# Patient Record
Sex: Male | Born: 1950 | Race: White | Hispanic: No | Marital: Single | Smoking: Never smoker
Health system: Southern US, Community
[De-identification: ages and names within clinical notes are randomized; demographics above are authoritative.]

## PROBLEM LIST (undated history)

## (undated) DIAGNOSIS — I2699 Other pulmonary embolism without acute cor pulmonale: Secondary | ICD-10-CM

## (undated) DIAGNOSIS — I82409 Acute embolism and thrombosis of unspecified deep veins of unspecified lower extremity: Secondary | ICD-10-CM

## (undated) DIAGNOSIS — I213 ST elevation (STEMI) myocardial infarction of unspecified site: Secondary | ICD-10-CM

## (undated) DIAGNOSIS — I10 Essential (primary) hypertension: Secondary | ICD-10-CM

## (undated) DIAGNOSIS — Z9889 Other specified postprocedural states: Secondary | ICD-10-CM

## (undated) DIAGNOSIS — J189 Pneumonia, unspecified organism: Secondary | ICD-10-CM

## (undated) DIAGNOSIS — Z955 Presence of coronary angioplasty implant and graft: Secondary | ICD-10-CM

## (undated) HISTORY — DX: Presence of coronary angioplasty implant and graft: Z95.5

## (undated) HISTORY — DX: Other specified postprocedural states: Z98.890

## (undated) HISTORY — DX: ST elevation (STEMI) myocardial infarction of unspecified site: I21.3

## (undated) HISTORY — DX: Essential (primary) hypertension: I10

---

## 2000-02-24 DIAGNOSIS — L309 Dermatitis, unspecified: Secondary | ICD-10-CM | POA: Insufficient documentation

## 2000-02-24 HISTORY — DX: Dermatitis, unspecified: L30.9

## 2007-05-09 DIAGNOSIS — G4700 Insomnia, unspecified: Secondary | ICD-10-CM

## 2007-05-09 HISTORY — DX: Insomnia, unspecified: G47.00

## 2008-12-03 DIAGNOSIS — E669 Obesity, unspecified: Secondary | ICD-10-CM | POA: Insufficient documentation

## 2008-12-03 HISTORY — DX: Obesity, unspecified: E66.9

## 2011-11-11 DIAGNOSIS — M23369 Other meniscus derangements, other lateral meniscus, unspecified knee: Secondary | ICD-10-CM | POA: Insufficient documentation

## 2011-11-11 HISTORY — DX: Other meniscus derangements, other lateral meniscus, unspecified knee: M23.369

## 2011-11-19 DIAGNOSIS — M179 Osteoarthritis of knee, unspecified: Secondary | ICD-10-CM | POA: Insufficient documentation

## 2011-11-19 HISTORY — DX: Osteoarthritis of knee, unspecified: M17.9

## 2014-07-04 DIAGNOSIS — I5031 Acute diastolic (congestive) heart failure: Secondary | ICD-10-CM

## 2014-07-04 DIAGNOSIS — N182 Chronic kidney disease, stage 2 (mild): Secondary | ICD-10-CM

## 2014-07-04 DIAGNOSIS — I82403 Acute embolism and thrombosis of unspecified deep veins of lower extremity, bilateral: Secondary | ICD-10-CM

## 2014-07-04 HISTORY — DX: Chronic kidney disease, stage 2 (mild): N18.2

## 2014-07-04 HISTORY — DX: Acute diastolic (congestive) heart failure: I50.31

## 2014-07-04 HISTORY — DX: Acute embolism and thrombosis of unspecified deep veins of lower extremity, bilateral: I82.403

## 2014-08-27 DIAGNOSIS — E781 Pure hyperglyceridemia: Secondary | ICD-10-CM | POA: Insufficient documentation

## 2014-08-27 HISTORY — DX: Pure hyperglyceridemia: E78.1

## 2015-01-15 DIAGNOSIS — L209 Atopic dermatitis, unspecified: Secondary | ICD-10-CM | POA: Insufficient documentation

## 2015-01-15 HISTORY — DX: Atopic dermatitis, unspecified: L20.9

## 2015-04-16 DIAGNOSIS — K621 Rectal polyp: Secondary | ICD-10-CM

## 2015-04-16 HISTORY — DX: Rectal polyp: K62.1

## 2016-04-16 DIAGNOSIS — D6859 Other primary thrombophilia: Secondary | ICD-10-CM | POA: Insufficient documentation

## 2016-04-16 HISTORY — DX: Other primary thrombophilia: D68.59

## 2018-06-14 DIAGNOSIS — E559 Vitamin D deficiency, unspecified: Secondary | ICD-10-CM | POA: Insufficient documentation

## 2018-06-14 HISTORY — DX: Vitamin D deficiency, unspecified: E55.9

## 2018-08-21 DIAGNOSIS — M62838 Other muscle spasm: Secondary | ICD-10-CM | POA: Insufficient documentation

## 2018-08-21 HISTORY — DX: Other muscle spasm: M62.838

## 2019-12-13 ENCOUNTER — Other Ambulatory Visit: Payer: Self-pay

## 2019-12-13 ENCOUNTER — Emergency Department (HOSPITAL_BASED_OUTPATIENT_CLINIC_OR_DEPARTMENT_OTHER): Payer: Medicare Other

## 2019-12-13 ENCOUNTER — Inpatient Hospital Stay (HOSPITAL_BASED_OUTPATIENT_CLINIC_OR_DEPARTMENT_OTHER)
Admission: EM | Admit: 2019-12-13 | Discharge: 2019-12-17 | DRG: 270 | Disposition: A | Discharge status: Home / Self Care | Payer: Medicare Other | Attending: Interventional Cardiology | Admitting: Interventional Cardiology

## 2019-12-13 ENCOUNTER — Encounter (HOSPITAL_COMMUNITY)
Admission: EM | Disposition: A | Discharge status: Home / Self Care | Payer: Self-pay | Source: Home / Self Care | Attending: Interventional Cardiology

## 2019-12-13 ENCOUNTER — Encounter (HOSPITAL_BASED_OUTPATIENT_CLINIC_OR_DEPARTMENT_OTHER): Payer: Self-pay

## 2019-12-13 DIAGNOSIS — I213 ST elevation (STEMI) myocardial infarction of unspecified site: Secondary | ICD-10-CM

## 2019-12-13 DIAGNOSIS — Z6839 Body mass index (BMI) 39.0-39.9, adult: Secondary | ICD-10-CM | POA: Diagnosis not present

## 2019-12-13 DIAGNOSIS — Z86711 Personal history of pulmonary embolism: Secondary | ICD-10-CM | POA: Diagnosis not present

## 2019-12-13 DIAGNOSIS — I2699 Other pulmonary embolism without acute cor pulmonale: Secondary | ICD-10-CM

## 2019-12-13 DIAGNOSIS — I2102 ST elevation (STEMI) myocardial infarction involving left anterior descending coronary artery: Secondary | ICD-10-CM

## 2019-12-13 DIAGNOSIS — Z86718 Personal history of other venous thrombosis and embolism: Secondary | ICD-10-CM | POA: Diagnosis not present

## 2019-12-13 DIAGNOSIS — Z9889 Other specified postprocedural states: Secondary | ICD-10-CM

## 2019-12-13 DIAGNOSIS — I2582 Chronic total occlusion of coronary artery: Secondary | ICD-10-CM | POA: Diagnosis not present

## 2019-12-13 DIAGNOSIS — Z79899 Other long term (current) drug therapy: Secondary | ICD-10-CM

## 2019-12-13 DIAGNOSIS — I130 Hypertensive heart and chronic kidney disease with heart failure and stage 1 through stage 4 chronic kidney disease, or unspecified chronic kidney disease: Secondary | ICD-10-CM | POA: Diagnosis not present

## 2019-12-13 DIAGNOSIS — J9601 Acute respiratory failure with hypoxia: Secondary | ICD-10-CM | POA: Diagnosis not present

## 2019-12-13 DIAGNOSIS — I5020 Unspecified systolic (congestive) heart failure: Secondary | ICD-10-CM

## 2019-12-13 DIAGNOSIS — I255 Ischemic cardiomyopathy: Secondary | ICD-10-CM | POA: Diagnosis not present

## 2019-12-13 DIAGNOSIS — Z8701 Personal history of pneumonia (recurrent): Secondary | ICD-10-CM

## 2019-12-13 DIAGNOSIS — I5021 Acute systolic (congestive) heart failure: Secondary | ICD-10-CM

## 2019-12-13 DIAGNOSIS — R570 Cardiogenic shock: Secondary | ICD-10-CM | POA: Diagnosis not present

## 2019-12-13 DIAGNOSIS — N179 Acute kidney failure, unspecified: Secondary | ICD-10-CM | POA: Diagnosis not present

## 2019-12-13 DIAGNOSIS — I5043 Acute on chronic combined systolic (congestive) and diastolic (congestive) heart failure: Secondary | ICD-10-CM | POA: Diagnosis not present

## 2019-12-13 DIAGNOSIS — Z20822 Contact with and (suspected) exposure to covid-19: Secondary | ICD-10-CM | POA: Diagnosis present

## 2019-12-13 DIAGNOSIS — Z7901 Long term (current) use of anticoagulants: Secondary | ICD-10-CM | POA: Diagnosis not present

## 2019-12-13 DIAGNOSIS — I2510 Atherosclerotic heart disease of native coronary artery without angina pectoris: Secondary | ICD-10-CM | POA: Diagnosis present

## 2019-12-13 DIAGNOSIS — E669 Obesity, unspecified: Secondary | ICD-10-CM | POA: Diagnosis not present

## 2019-12-13 DIAGNOSIS — Z955 Presence of coronary angioplasty implant and graft: Secondary | ICD-10-CM

## 2019-12-13 DIAGNOSIS — E785 Hyperlipidemia, unspecified: Secondary | ICD-10-CM | POA: Diagnosis present

## 2019-12-13 DIAGNOSIS — N184 Chronic kidney disease, stage 4 (severe): Secondary | ICD-10-CM

## 2019-12-13 HISTORY — DX: Acute embolism and thrombosis of unspecified deep veins of unspecified lower extremity: I82.409

## 2019-12-13 HISTORY — DX: Other pulmonary embolism without acute cor pulmonale: I26.99

## 2019-12-13 HISTORY — DX: Essential (primary) hypertension: I10

## 2019-12-13 HISTORY — PX: IABP INSERTION: CATH118242

## 2019-12-13 HISTORY — PX: CORONARY STENT INTERVENTION: CATH118234

## 2019-12-13 HISTORY — DX: Acute systolic (congestive) heart failure: I50.21

## 2019-12-13 HISTORY — DX: Chronic kidney disease, stage 4 (severe): N18.4

## 2019-12-13 HISTORY — PX: CORONARY/GRAFT ACUTE MI REVASCULARIZATION: CATH118305

## 2019-12-13 HISTORY — DX: ST elevation (STEMI) myocardial infarction involving left anterior descending coronary artery: I21.02

## 2019-12-13 HISTORY — PX: LEFT HEART CATH AND CORONARY ANGIOGRAPHY: CATH118249

## 2019-12-13 HISTORY — DX: Pneumonia, unspecified organism: J18.9

## 2019-12-13 LAB — CBC WITH DIFFERENTIAL/PLATELET
Abs Immature Granulocytes: 0.05 10*3/uL (ref 0.00–0.07)
Basophils Absolute: 0.1 10*3/uL (ref 0.0–0.1)
Basophils Relative: 1 %
Eosinophils Absolute: 0.5 10*3/uL (ref 0.0–0.5)
Eosinophils Relative: 4 %
HCT: 49.4 % (ref 39.0–52.0)
Hemoglobin: 17.1 g/dL — ABNORMAL HIGH (ref 13.0–17.0)
Immature Granulocytes: 0 %
Lymphocytes Relative: 37 %
Lymphs Abs: 4.7 10*3/uL — ABNORMAL HIGH (ref 0.7–4.0)
MCH: 34.8 pg — ABNORMAL HIGH (ref 26.0–34.0)
MCHC: 34.6 g/dL (ref 30.0–36.0)
MCV: 100.6 fL — ABNORMAL HIGH (ref 80.0–100.0)
Monocytes Absolute: 1.5 10*3/uL — ABNORMAL HIGH (ref 0.1–1.0)
Monocytes Relative: 12 %
Neutro Abs: 5.9 10*3/uL (ref 1.7–7.7)
Neutrophils Relative %: 46 %
Platelets: 287 10*3/uL (ref 150–400)
RBC: 4.91 MIL/uL (ref 4.22–5.81)
RDW: 12.3 % (ref 11.5–15.5)
WBC: 12.7 10*3/uL — ABNORMAL HIGH (ref 4.0–10.5)
nRBC: 0 % (ref 0.0–0.2)

## 2019-12-13 LAB — PROTIME-INR
INR: 1 (ref 0.8–1.2)
Prothrombin Time: 13 seconds (ref 11.4–15.2)

## 2019-12-13 LAB — COMPREHENSIVE METABOLIC PANEL
ALT: 27 U/L (ref 0–44)
AST: 31 U/L (ref 15–41)
Albumin: 3.7 g/dL (ref 3.5–5.0)
Alkaline Phosphatase: 70 U/L (ref 38–126)
Anion gap: 12 (ref 5–15)
BUN: 29 mg/dL — ABNORMAL HIGH (ref 8–23)
CO2: 22 mmol/L (ref 22–32)
Calcium: 9.1 mg/dL (ref 8.9–10.3)
Chloride: 102 mmol/L (ref 98–111)
Creatinine, Ser: 1.9 mg/dL — ABNORMAL HIGH (ref 0.61–1.24)
GFR calc Af Amer: 41 mL/min — ABNORMAL LOW (ref >60)
GFR calc non Af Amer: 36 mL/min — ABNORMAL LOW (ref >60)
Glucose, Bld: 132 mg/dL — ABNORMAL HIGH (ref 70–99)
Potassium: 3.6 mmol/L (ref 3.5–5.1)
Sodium: 136 mmol/L (ref 135–145)
Total Bilirubin: 0.6 mg/dL (ref 0.3–1.2)
Total Protein: 7.3 g/dL (ref 6.5–8.1)

## 2019-12-13 LAB — RESPIRATORY PANEL BY RT PCR (FLU A&B, COVID)
Influenza A by PCR: NEGATIVE
Influenza B by PCR: NEGATIVE
SARS Coronavirus 2 by RT PCR: NEGATIVE

## 2019-12-13 LAB — TROPONIN I (HIGH SENSITIVITY): Troponin I (High Sensitivity): 19 ng/L — ABNORMAL HIGH (ref <18)

## 2019-12-13 LAB — APTT: aPTT: 29 seconds (ref 24–36)

## 2019-12-13 SURGERY — CORONARY/GRAFT ACUTE MI REVASCULARIZATION
Anesthesia: LOCAL

## 2019-12-13 MED ORDER — TICAGRELOR 90 MG PO TABS: ORAL_TABLET | ORAL | Status: DC | PRN

## 2019-12-13 MED ORDER — NITROGLYCERIN IN D5W 200-5 MCG/ML-% IV SOLN
INTRAVENOUS | Status: AC
  Filled 2019-12-13: qty 250

## 2019-12-13 MED ORDER — HEPARIN (PORCINE) IN NACL 1000-0.9 UT/500ML-% IV SOLN
INTRAVENOUS | Status: DC | PRN
  Administered 2019-12-13 (×3): 500 mL

## 2019-12-13 MED ORDER — NITROGLYCERIN 0.4 MG SL SUBL
0.4000 mg | SUBLINGUAL_TABLET | SUBLINGUAL | Status: DC | PRN
  Administered 2019-12-13: 0.4 mg via SUBLINGUAL

## 2019-12-13 MED ORDER — VERAPAMIL HCL 2.5 MG/ML IV SOLN
INTRAVENOUS | Status: AC
  Filled 2019-12-13: qty 2

## 2019-12-13 MED ORDER — TIROFIBAN HCL IN NACL 5-0.9 MG/100ML-% IV SOLN
INTRAVENOUS | Status: AC
  Filled 2019-12-13: qty 100

## 2019-12-13 MED ORDER — NITROGLYCERIN 1 MG/10 ML FOR IR/CATH LAB
INTRA_ARTERIAL | Status: DC | PRN
  Administered 2019-12-13: 200 ug via INTRACORONARY

## 2019-12-13 MED ORDER — LIDOCAINE HCL (PF) 1 % IJ SOLN
INTRAMUSCULAR | Status: AC
  Filled 2019-12-13: qty 30

## 2019-12-13 MED ORDER — VERAPAMIL HCL 2.5 MG/ML IV SOLN
INTRAVENOUS | Status: DC | PRN
  Administered 2019-12-13: 10 mL via INTRA_ARTERIAL

## 2019-12-13 MED ORDER — FUROSEMIDE 10 MG/ML IJ SOLN
INTRAMUSCULAR | Status: AC
  Filled 2019-12-13: qty 4

## 2019-12-13 MED ORDER — HEPARIN SODIUM (PORCINE) 1000 UNIT/ML IJ SOLN
INTRAMUSCULAR | Status: DC | PRN
  Administered 2019-12-13: 8000 [IU] via INTRAVENOUS
  Administered 2019-12-13: 3000 [IU] via INTRAVENOUS

## 2019-12-13 MED ORDER — CLOPIDOGREL BISULFATE 300 MG PO TABS
ORAL_TABLET | ORAL | Status: AC
  Filled 2019-12-13: qty 2

## 2019-12-13 MED ORDER — HEPARIN SODIUM (PORCINE) 5000 UNIT/ML IJ SOLN
4000.0000 [IU] | Freq: Once | INTRAMUSCULAR | Status: AC
  Administered 2019-12-13: 4000 [IU] via INTRAVENOUS

## 2019-12-13 MED ORDER — TIROFIBAN HCL IN NACL 5-0.9 MG/100ML-% IV SOLN
INTRAVENOUS | Status: AC | PRN
  Administered 2019-12-13 (×2): 0.075 ug/kg/min via INTRAVENOUS

## 2019-12-13 MED ORDER — LIDOCAINE HCL (PF) 1 % IJ SOLN
INTRAMUSCULAR | Status: DC | PRN
  Administered 2019-12-13: 2 mL
  Administered 2019-12-13: 15 mL

## 2019-12-13 MED ORDER — SODIUM CHLORIDE 0.9 % IV SOLN
INTRAVENOUS | Status: AC | PRN
  Administered 2019-12-13: 20 mL/h via INTRAVENOUS

## 2019-12-13 MED ORDER — HEPARIN SODIUM (PORCINE) 1000 UNIT/ML IJ SOLN
INTRAMUSCULAR | Status: AC
  Filled 2019-12-13: qty 1

## 2019-12-13 MED ORDER — FUROSEMIDE 10 MG/ML IJ SOLN
INTRAMUSCULAR | Status: DC | PRN
  Administered 2019-12-13: 40 mg via INTRAVENOUS

## 2019-12-13 MED ORDER — SODIUM CHLORIDE 0.9 % IV SOLN
INTRAVENOUS | Status: DC | PRN
  Administered 2019-12-13: 10 mL/h via INTRAVENOUS

## 2019-12-13 MED ORDER — ASPIRIN 81 MG PO CHEW: 324.0000 mg | CHEWABLE_TABLET | Freq: Once | ORAL | Status: AC

## 2019-12-13 MED ORDER — IOHEXOL 350 MG/ML SOLN
INTRAVENOUS | Status: DC | PRN
  Administered 2019-12-13: 160 mL via INTRA_ARTERIAL

## 2019-12-13 MED ORDER — NITROGLYCERIN IN D5W 200-5 MCG/ML-% IV SOLN
INTRAVENOUS | Status: AC | PRN
  Administered 2019-12-13: 10 ug/min via INTRAVENOUS

## 2019-12-13 MED ORDER — FENTANYL CITRATE (PF) 100 MCG/2ML IJ SOLN
INTRAMUSCULAR | Status: DC | PRN
  Administered 2019-12-13: 25 ug via INTRAVENOUS

## 2019-12-13 MED ORDER — NITROGLYCERIN 0.4 MG SL SUBL
SUBLINGUAL_TABLET | SUBLINGUAL | Status: AC
  Filled 2019-12-13: qty 3

## 2019-12-13 MED ORDER — ASPIRIN 81 MG PO CHEW
CHEWABLE_TABLET | ORAL | Status: AC
  Administered 2019-12-13: 324 mg via ORAL
  Filled 2019-12-13: qty 4

## 2019-12-13 MED ORDER — SODIUM CHLORIDE 0.9 % IV SOLN: INTRAVENOUS | Status: DC

## 2019-12-13 MED ORDER — CLOPIDOGREL BISULFATE 300 MG PO TABS
ORAL_TABLET | ORAL | Status: DC | PRN
  Administered 2019-12-13: 600 mg via ORAL

## 2019-12-13 MED ORDER — FENTANYL CITRATE (PF) 100 MCG/2ML IJ SOLN
INTRAMUSCULAR | Status: AC
  Filled 2019-12-13: qty 2

## 2019-12-13 MED ORDER — TIROFIBAN (AGGRASTAT) BOLUS VIA INFUSION
INTRAVENOUS | Status: DC | PRN
  Administered 2019-12-13: 3267.5 ug via INTRAVENOUS

## 2019-12-13 MED ORDER — IOHEXOL 350 MG/ML SOLN
INTRAVENOUS | Status: AC
  Filled 2019-12-13: qty 1

## 2019-12-13 MED ORDER — HEPARIN SODIUM (PORCINE) 5000 UNIT/ML IJ SOLN
INTRAMUSCULAR | Status: AC
  Filled 2019-12-13: qty 1

## 2019-12-13 SURGICAL SUPPLY — 22 items
BALLN IABP SENSA PLUS 8F 50CC (BALLOONS) ×2
BALLN SAPPHIRE 2.5X12 (BALLOONS) ×2
BALLOON IABP SENS PLUS 8F 50CC (BALLOONS) ×1 IMPLANT
BALLOON SAPPHIRE 2.5X12 (BALLOONS) ×1 IMPLANT
CATH 5FR JL3.5 JR4 ANG PIG MP (CATHETERS) ×2 IMPLANT
CATH TELESCOPE 6F GEC (CATHETERS) ×2 IMPLANT
CATH VISTA GUIDE 6FR XB3.5 (CATHETERS) ×2 IMPLANT
DEVICE RAD COMP TR BAND LRG (VASCULAR PRODUCTS) ×2 IMPLANT
GLIDESHEATH SLEND A-KIT 6F 22G (SHEATH) ×2 IMPLANT
GUIDEWIRE INQWIRE 1.5J.035X260 (WIRE) ×1 IMPLANT
INQWIRE 1.5J .035X260CM (WIRE) ×2
KIT ENCORE 26 ADVANTAGE (KITS) ×2 IMPLANT
KIT HEART LEFT (KITS) ×2 IMPLANT
PACK CARDIAC CATHETERIZATION (CUSTOM PROCEDURE TRAY) ×2 IMPLANT
SHEATH PINNACLE 6F 10CM (SHEATH) ×2 IMPLANT
SHEATH PROBE COVER 6X72 (BAG) ×2 IMPLANT
STENT RESOLUTE ONYX 3.0X26 (Permanent Stent) ×2 IMPLANT
STENT RESOLUTE ONYX 3.0X34 (Permanent Stent) ×2 IMPLANT
TRANSDUCER W/STOPCOCK (MISCELLANEOUS) ×2 IMPLANT
TUBING CIL FLEX 10 FLL-RA (TUBING) ×2 IMPLANT
WIRE ASAHI PROWATER 180CM (WIRE) ×2 IMPLANT
WIRE EMERALD 3MM-J .035X150CM (WIRE) ×2 IMPLANT

## 2019-12-13 NOTE — ED Notes (Signed)
Code STEMI called to [facility]) -

## 2019-12-13 NOTE — CV Procedure (Signed)
   Acute anterior ST elevation MI commencing at 7 PM.  Hypertensive on arrival with early pulmonary edema on chest x-ray, normal O2 saturations, atypical EKG for anterior MI with lead lead V2 reviewed for ST elevation and right bundle branch block.  Real-time vascular ultrasound was used to achieve right radial artery access.  JR4 catheter was used to perform left ventriculography by hand-injection, hemodynamic recordings, and right coronary angiography.  Left ventricular end-diastolic pressure was 34 mmHg  A 6 French 3.5 cm XB left coronary guide catheter was used for left coronary angiography.  Angiography demonstrated proximal total occlusion of the LAD.  A large ramus and moderate size circumflex were widely patent without significant obstruction.  PCI was performed using a 0.014 [place] guidewire.  The guide catheter did not provide sufficient support however we were able to cross the stenosis and move the guidewire into the mid to distal LAD.  Wire advancement would back the guide cath out of the left main ostium..  Angioplasty was performed in sequential manner using a 12 mm x 2.5 balloon from proximal to mid vessel.  This reestablished flow but there was significant thrombus burden.  TIMI grade II flow was established.  A telescope guide catheter extension was needed advanced long stents into the LAD.  The initial stent was a 34 x 3.0 Onyx.  This was deployed at 14 atm x 2.  The next it was a 26 x 3.0 Onyx also deployed at 14 atm x 2.  The deployment balloon was further advanced dilate the overlap to 15 atm.  TIMI grade 2.5 flow was established.  Symptoms began to improve.  During the procedure, the patient began having respiratory difficulty, coughing, and having phlegm coming into the back of his throat.  We realized during the case that the patient's creatinine was 1.9.  40 mg of IV Lasix was administered.  A nonrebreather mask was placed.  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  Real-time vascular ultrasound was used to perform a single anterior wall stick slightly above the bifurcation which was high in origin.  Balloon pumping starting at 1-1 with good pressure augmentation.  Is gradually led to resolution of chest pain and shortness of breath significantly improved.  Pharmacology received during the procedure was 15,000 units of heparin, a bolus and infusion of Aggrastat, Plavix 600 mg orally, and earlier today the patient received 2.5 mg of Eliquis.  IV nitroglycerin drip was started at 20 mcg/min.  Complicated high risk patient with acute heart failure, stage IV chronic kidney disease (160 cc of contrast was used), and coronary disease with total occlusion of the distal right coronary in the setting of acute anterior infarction with proximal LAD occlusion.  Outcome is guarded.  We will not be able to hydrate the patient very well given high EDP and heart failure.  Intra-aortic balloon pump should help to unload the patient's ventricle and IV NTG will improve microcirculation given heavy thrombus load.

## 2019-12-13 NOTE — ED Triage Notes (Signed)
Pt c/o CP x 1 hour-NAD-steady gait °

## 2019-12-13 NOTE — ED Notes (Signed)
  [facility] transporting patient to [facility] cath lab, left at 2146.  Report called to [facility] cath lab at 2153

## 2019-12-13 NOTE — ED Notes (Signed)
MD at bedside. Informed to hold Nitro b/p

## 2019-12-13 NOTE — ED Notes (Signed)
Pt is out of department via [place]

## 2019-12-13 NOTE — ED Provider Notes (Signed)
[facility] Provider Note   CSN: [id number] Arrival date & time: 12/13/19  2109     History Chief Complaint  Patient presents with  . Chest Pain    Boss Danielsen is a 69 y.o. male.  HPI 69 year old male presents with chest pain.  Started an hour ago and feels like a burning in the middle of his chest.  No diaphoresis but he does have nausea.  No real shortness of breath.  No back pain.  Pain is 8 out of 10.  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  No prior coronary disease history.  No smoking history.   Past Medical History:  Diagnosis Date  . DVT (deep venous thrombosis) ([place])   . Hypertension   . Pneumonia   . Pulmonary emboli ([facility])     There are no problems to display for this patient.   History reviewed. No pertinent surgical history.     No family history on file.  Social History   Tobacco Use  . Smoking status: Never Smoker  . Smokeless tobacco: Current User  Substance Use Topics  . Alcohol use: Yes    Comment: daily  . Drug use: Never    Home Medications Prior to Admission medications   Medication Sig Start Date End Date Taking? Authorizing Provider  ELIQUIS 2.5 MG TABS tablet Take 2.5 mg by mouth 2 (two) times daily. 10/08/19   [provider]  metoprolol tartrate (LOPRESSOR) 50 MG tablet Take 50 mg by mouth 2 (two) times daily. 11/18/19   [provider]  spironolactone (ALDACTONE) 25 MG tablet Take 25 mg by mouth every morning. 10/03/19   [provider]    Allergies    Patient has no known allergies.  Review of Systems   Review of Systems  Constitutional: Negative for diaphoresis and fever.  Respiratory: Negative for shortness of breath.   Cardiovascular: Positive for chest pain.  Gastrointestinal: Positive for nausea. Negative for vomiting.  Musculoskeletal: Negative for back pain.  All other systems reviewed and are negative.   Physical Exam Updated  Vital Signs BP (!) 130/103 (BP Location: Left Arm)   Pulse 86   Resp 20   Ht 5\' 9"  (1.753 m)   Wt 130.7 kg   SpO2 98%   BMI 42.54 kg/m   Physical Exam Vitals and nursing note reviewed.  Constitutional:      Appearance: He is well-developed. He is obese. He is not diaphoretic.  HENT:     Head: Normocephalic and atraumatic.     Right Ear: External ear normal.     Left Ear: External ear normal.     Nose: Nose normal.  Eyes:     General:        Right eye: No discharge.        Left eye: No discharge.  Cardiovascular:     Rate and Rhythm: Normal rate and regular rhythm.     Heart sounds: Normal heart sounds.  Pulmonary:     Effort: Pulmonary effort is normal.     Breath sounds: Normal breath sounds.  Chest:     Chest wall: No tenderness.  Abdominal:     Palpations: Abdomen is soft.     Tenderness: There is no abdominal tenderness.  Musculoskeletal:     Cervical back: Neck supple.  Skin:    General: Skin is warm and dry.  Neurological:     Mental Status: He is alert.  Psychiatric:        Mood and Affect: Mood is not anxious.     ED Results / Procedures / Treatments   Labs (all labs ordered are listed, but only abnormal results are displayed) Labs Reviewed  CBC WITH DIFFERENTIAL/PLATELET - Abnormal; Notable for the following components:      Result Value   WBC 12.7 (*)    Hemoglobin 17.1 (*)    MCV 100.6 (*)    MCH 34.8 (*)    Lymphs Abs 4.7 (*)    Monocytes Absolute 1.5 (*)    All other components within normal limits  RESPIRATORY PANEL BY RT PCR (FLU A&B, COVID)  PROTIME-INR  APTT  HEMOGLOBIN A1C  COMPREHENSIVE METABOLIC PANEL  LIPID PANEL  TROPONIN I (HIGH SENSITIVITY)    EKG None  ED ECG REPORT   Date: 12/13/2019  Rate: 75  Rhythm: normal sinus rhythm  QRS Axis: normal  Intervals: normal  ST/T Wave abnormalities: ST elevations anteriorly  Conduction Disutrbances:right bundle branch block  Narrative Interpretation:   Old EKG Reviewed: none  available  I have personally reviewed the EKG tracing and agree with the computerized printout as noted.        Radiology DG Chest Port 1 View  Result Date: 12/13/2019 CLINICAL DATA:  Chest pain for 1 hour EXAM: PORTABLE CHEST 1 VIEW COMPARISON:  None. FINDINGS: No pleural effusion or focal consolidation. Borderline cardiomegaly. Mild central vascular congestion. Diffuse interstitial opacity. No pneumothorax. IMPRESSION: Borderline cardiomegaly with mild central vascular congestion. Diffuse interstitial opacity, could reflect mild interstitial edema. Electronically Signed   By: Donavan Foil M.D.   On: 12/13/2019 21:39    Procedures .Critical Care Performed by: Sherwood Gambler, MD Authorized by: Sherwood Gambler, MD   Critical care provider statement:    Critical care time (minutes):  30   Critical care time was exclusive of:  Separately billable procedures and treating other patients   Critical care was necessary to treat or prevent imminent or life-threatening deterioration of the following conditions:  Cardiac failure   Critical care was time spent personally by me on the following activities:  Discussions with consultants, evaluation of patient's response to treatment, examination of patient, ordering and performing treatments and interventions, ordering and review of laboratory studies, ordering and review of radiographic studies, pulse oximetry, re-evaluation of patient's condition, obtaining history from patient or surrogate and review of old charts   (including critical care time)  Medications Ordered in ED Medications  0.9 %  sodium chloride infusion (has no administration in time range)  nitroGLYCERIN (NITROSTAT) SL tablet 0.4 mg (0.4 mg Sublingual Given 12/13/19 2128)  nitroGLYCERIN (NITROSTAT) 0.4 MG SL tablet (has no administration in time range)  aspirin chewable tablet 324 mg (324 mg Oral Given 12/13/19 2132)  heparin injection 4,000 Units (4,000 Units Intravenous Given  12/13/19 2128)    ED Course  I have reviewed the triage vital signs and the nursing notes.  Pertinent labs & imaging results that were available during my care of the patient were reviewed by me and considered in my medical decision making (see chart for details).  Clinical Course as of Dec 12 2149  Thu Dec 13, 2019  2129 Discussed with Dr. Tamala Julian.  He cannot see the ECG but will activate code STEMI.  Will give aspirin and heparin.  [facility] is sending [facility] for emergent transport.   [SG]    Clinical Course User Index [SG] Sherwood Gambler, MD   MDM Rules/Calculators/A&P  ECG concerning for anterior STEMI.  V3 is definitively elevated and it appears to be for his as well though complicated by a bundle branch block.  No old to compare to.  He is hemodynamically stable though hypertensive.  Getting some pain relief with nitro and was given aspirin and heparin.  Will send for emergent catheterization and STEMI alert.  Dr. Francia Greaves aware, but patient should be going straight to the Cath Lab. Final Clinical Impression(s) / ED Diagnoses Final diagnoses:  ST elevation myocardial infarction (STEMI), unspecified artery [facility])    Rx / DC Orders ED Discharge Orders    None       Sherwood Gambler, MD 12/13/19 2152

## 2019-12-13 NOTE — H&P (Signed)
Cardiology Admission History and Physical:   Patient ID: Thomas Carpenter MRN: [id number]; DOB: 10/07/1950   Admission date: 12/13/2019  Primary Care Provider: System, Pcp Not In Primary Cardiologist: No primary care provider on file.  Primary Electrophysiologist:  None   Chief Complaint:  Chest pain  Patient Profile:   Thomas Carpenter is a 69 y.o. 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   History of Present Illness:   Thomas Carpenter reports acute onset chest pain beginning around 7pm. Never had anything like this before. The pain felt like a burning in the center of his chest and was associated with nausea. Has some shortness of breath and cough with his chest pain.   Patient has no prior history of coronary disease and is a never smoker.   Came to [place] and was found to have anterior ST elevation on ECG. Was transferred to [facility] cath lab for STEMI.   Heart Pathway Score:     Past Medical History:  Diagnosis Date  . DVT (deep venous thrombosis) ([place])   . Hypertension   . Pneumonia   . Pulmonary emboli ([place])     History reviewed. No pertinent surgical history.   Medications Prior to Admission: Prior to Admission medications   Medication Sig Start Date End Date Taking? Authorizing Provider  ELIQUIS 2.5 MG TABS tablet Take 2.5 mg by mouth 2 (two) times daily. 10/08/19   [provider]  metoprolol tartrate (LOPRESSOR) 50 MG tablet Take 50 mg by mouth 2 (two) times daily. 11/18/19   [provider]  spironolactone (ALDACTONE) 25 MG tablet Take 25 mg by mouth every morning. 10/03/19   [provider]     Allergies:   No Known Allergies  Social History:   Social History   Socioeconomic History  . Marital status: Single    Spouse name: Not on file  . Number of children: Not on file  . Years of education: Not on file  . Highest education level: Not on file  Occupational History  .  Not on file  Tobacco Use  . Smoking status: Never Smoker  . Smokeless tobacco: Current User  Substance and Sexual Activity  . Alcohol use: Yes    Comment: daily  . Drug use: Never  . Sexual activity: Not on file  Other Topics Concern  . Not on file  Social History Narrative  . Not on file   Social Determinants of Health   Financial Resource Strain:   . Difficulty of Paying Living Expenses:   Food Insecurity:   . Worried About Charity fundraiser in the Last Year:   . Arboriculturist in the Last Year:   Transportation Needs:   . Film/video editor (Medical):   Marland Kitchen Lack of Transportation (Non-Medical):   Physical Activity:   . Days of Exercise per Week:   . Minutes of Exercise per Session:   Stress:   . Feeling of Stress :   Social Connections:   . Frequency of Communication with Friends and Family:   . Frequency of Social Gatherings with Friends and Family:   . Attends Religious Services:   . Active Member of Clubs or Organizations:   . Attends Archivist Meetings:   Marland Kitchen Marital Status:   Intimate Partner Violence:   . Fear of Current or Ex-Partner:   . Emotionally Abused:   Marland Kitchen Physically Abused:   . Sexually  Abused:     Family History:   The patient's family history is not on file.    ROS:  Please see the history of present illness.  All other ROS reviewed and negative.     Physical Exam/Data:   Vitals:   12/13/19 2256 12/13/19 2321 12/13/19 2321 12/13/19 2325  BP:    (!) 162/105  Pulse:      Resp:      Temp:      TempSrc:      SpO2: 91% 91% 99%   Weight:      Height:       No intake or output data in the 24 hours ending 12/13/19 2350 Last 3 Weights 12/13/2019  Weight (lbs) 288 lb 1.6 oz  Weight (kg) 130.681 kg     Body mass index is 42.54 kg/m.  General:  Well nourished, well developed, tachypneic HEENT: normal Lymph: no adenopathy Neck: unable to assess JVD due to habitus Endocrine:  No thryomegaly Vascular: No carotid bruits; FA  pulses 2+ bilaterally without bruits  Cardiac:  normal S1, S2; RRR; soft systolic murmur heard at apex Lungs:  Faint crackles bilaterally Abd: soft, nontender, obese, no hepatomegaly  Ext: 2+ pitting edema of BLE Musculoskeletal:  No deformities, BUE and BLE strength normal and equal Skin: warm and dry  Neuro:  CNs 2-12 intact, no focal abnormalities noted Psych:  Normal affect    EKG:  The ECG that was done with EMS was personally reviewed and demonstrates anterior ST elevation  Relevant CV Studies: None  Laboratory Data:  High Sensitivity Troponin:   Recent Labs  Lab 12/13/19 2125  TROPONINIHS 19*      Chemistry Recent Labs  Lab 12/13/19 2125  NA 136  K 3.6  CL 102  CO2 22  GLUCOSE 132*  BUN 29*  CREATININE 1.90*  CALCIUM 9.1  GFRNONAA 36*  GFRAA 41*  ANIONGAP 12    Recent Labs  Lab 12/13/19 2125  PROT 7.3  ALBUMIN 3.7  AST 31  ALT 27  ALKPHOS 70  BILITOT 0.6   Hematology Recent Labs  Lab 12/13/19 2125  WBC 12.7*  RBC 4.91  HGB 17.1*  HCT 49.4  MCV 100.6*  MCH 34.8*  MCHC 34.6  RDW 12.3  PLT 287   BNPNo results for input(s): BNP, PROBNP in the last 168 hours.  DDimer No results for input(s): DDIMER in the last 168 hours.   Radiology/Studies:  DG Chest Port 1 View  Result Date: 12/13/2019 CLINICAL DATA:  Chest pain for 1 hour EXAM: PORTABLE CHEST 1 VIEW COMPARISON:  None. FINDINGS: No pleural effusion or focal consolidation. Borderline cardiomegaly. Mild central vascular congestion. Diffuse interstitial opacity. No pneumothorax. IMPRESSION: Borderline cardiomegaly with mild central vascular congestion. Diffuse interstitial opacity, could reflect mild interstitial edema. Electronically Signed   By: Donavan Foil M.D.   On: 12/13/2019 21:39       TIMI Risk Score for ST  Elevation MI:   The patient's TIMI risk score is 8, which indicates a 26.8% risk of all cause mortality at 30 days.    Assessment and Plan:  Thomas Carpenter is a 69 y.o.  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 Now s/p PCI to occluded prox LAD. Patient developed respiratory distress during case and found to have LVEDP of 36. IABP placed.    #) STEMI:  Proximal LAD occlusion - echo in AM - check lipids, A1c - ASA  81mg  daily - heparin drip for ACS per pharmacy protocol - atorvastatin 80mg  QHS - hold ACE given contrast load and elevated creatinine - start metoprolol tartrate low dose - SLN, nitro gtt PRN - plavix 600mg  x 1 then 75mg  daily (given that he will need oral anticoagulant as well) - cardiac rehab - IABP management as per below  #) Hypoxemia, respiratory distress: IABP placed and patient on non-rebreather with improvement in symptoms. S/p 40mg  IV lasix x1.  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 Inpatient status is judged to be reasonable and necessary in order to provide the required intensity of service to ensure the patient's safety. The patient's presenting symptoms, physical exam findings, and initial radiographic and laboratory data in the context of their chronic comorbidities is felt to place them at high risk for further clinical deterioration. Furthermore, it is not anticipated that the patient will be medically stable for discharge from the hospital within 2 midnights of  admission. The following factors support the patient status of inpatient.   " The patient's presenting symptoms include chest pain. " The worrisome physical exam findings include pulmonary rales. " The initial radiographic and laboratory data are worrisome because of pulmonary edema. " The chronic co-morbidities include CKD.   * I certify that at the point of admission it is my clinical judgment that the patient will require inpatient hospital care spanning beyond 2 midnights from the point of admission due to high intensity of service, high risk for further deterioration and high frequency of surveillance required.*    For questions or updates, please contact [place] Please consult [email or web address] for contact info under        Signed, Marcie Mowers, MD  12/13/2019 11:50 PM

## 2019-12-14 ENCOUNTER — Other Ambulatory Visit: Payer: Self-pay

## 2019-12-14 ENCOUNTER — Inpatient Hospital Stay (HOSPITAL_COMMUNITY): Payer: Medicare Other

## 2019-12-14 DIAGNOSIS — N184 Chronic kidney disease, stage 4 (severe): Secondary | ICD-10-CM | POA: Diagnosis present

## 2019-12-14 DIAGNOSIS — I2102 ST elevation (STEMI) myocardial infarction involving left anterior descending coronary artery: Secondary | ICD-10-CM | POA: Diagnosis present

## 2019-12-14 DIAGNOSIS — E785 Hyperlipidemia, unspecified: Secondary | ICD-10-CM | POA: Diagnosis present

## 2019-12-14 DIAGNOSIS — Z79899 Other long term (current) drug therapy: Secondary | ICD-10-CM | POA: Diagnosis not present

## 2019-12-14 DIAGNOSIS — I255 Ischemic cardiomyopathy: Secondary | ICD-10-CM | POA: Diagnosis present

## 2019-12-14 DIAGNOSIS — I2582 Chronic total occlusion of coronary artery: Secondary | ICD-10-CM | POA: Diagnosis present

## 2019-12-14 DIAGNOSIS — Z86711 Personal history of pulmonary embolism: Secondary | ICD-10-CM | POA: Diagnosis not present

## 2019-12-14 DIAGNOSIS — I213 ST elevation (STEMI) myocardial infarction of unspecified site: Secondary | ICD-10-CM | POA: Diagnosis present

## 2019-12-14 DIAGNOSIS — I2510 Atherosclerotic heart disease of native coronary artery without angina pectoris: Secondary | ICD-10-CM | POA: Diagnosis present

## 2019-12-14 DIAGNOSIS — Z8701 Personal history of pneumonia (recurrent): Secondary | ICD-10-CM | POA: Diagnosis not present

## 2019-12-14 DIAGNOSIS — I2699 Other pulmonary embolism without acute cor pulmonale: Secondary | ICD-10-CM

## 2019-12-14 DIAGNOSIS — Z20822 Contact with and (suspected) exposure to covid-19: Secondary | ICD-10-CM | POA: Diagnosis present

## 2019-12-14 DIAGNOSIS — Z9889 Other specified postprocedural states: Secondary | ICD-10-CM | POA: Diagnosis not present

## 2019-12-14 DIAGNOSIS — I5041 Acute combined systolic (congestive) and diastolic (congestive) heart failure: Secondary | ICD-10-CM

## 2019-12-14 DIAGNOSIS — E669 Obesity, unspecified: Secondary | ICD-10-CM | POA: Diagnosis present

## 2019-12-14 DIAGNOSIS — Z86718 Personal history of other venous thrombosis and embolism: Secondary | ICD-10-CM | POA: Diagnosis not present

## 2019-12-14 DIAGNOSIS — I130 Hypertensive heart and chronic kidney disease with heart failure and stage 1 through stage 4 chronic kidney disease, or unspecified chronic kidney disease: Secondary | ICD-10-CM | POA: Diagnosis present

## 2019-12-14 DIAGNOSIS — R570 Cardiogenic shock: Secondary | ICD-10-CM | POA: Diagnosis not present

## 2019-12-14 DIAGNOSIS — J9601 Acute respiratory failure with hypoxia: Secondary | ICD-10-CM | POA: Diagnosis not present

## 2019-12-14 DIAGNOSIS — N179 Acute kidney failure, unspecified: Secondary | ICD-10-CM | POA: Diagnosis not present

## 2019-12-14 DIAGNOSIS — Z7901 Long term (current) use of anticoagulants: Secondary | ICD-10-CM | POA: Diagnosis not present

## 2019-12-14 DIAGNOSIS — Z6839 Body mass index (BMI) 39.0-39.9, adult: Secondary | ICD-10-CM | POA: Diagnosis not present

## 2019-12-14 DIAGNOSIS — I5043 Acute on chronic combined systolic (congestive) and diastolic (congestive) heart failure: Secondary | ICD-10-CM | POA: Diagnosis not present

## 2019-12-14 HISTORY — DX: ST elevation (STEMI) myocardial infarction involving left anterior descending coronary artery: I21.02

## 2019-12-14 LAB — ECHOCARDIOGRAM COMPLETE
Height: 69 in
Weight: 4609.6 oz

## 2019-12-14 LAB — POCT ACTIVATED CLOTTING TIME
Activated Clotting Time: 340 seconds
Activated Clotting Time: 422 seconds

## 2019-12-14 LAB — BASIC METABOLIC PANEL
Anion gap: 15 (ref 5–15)
BUN: 31 mg/dL — ABNORMAL HIGH (ref 8–23)
CO2: 21 mmol/L — ABNORMAL LOW (ref 22–32)
Calcium: 9.2 mg/dL (ref 8.9–10.3)
Chloride: 100 mmol/L (ref 98–111)
Creatinine, Ser: 1.81 mg/dL — ABNORMAL HIGH (ref 0.61–1.24)
GFR calc Af Amer: 44 mL/min — ABNORMAL LOW (ref >60)
GFR calc non Af Amer: 38 mL/min — ABNORMAL LOW (ref >60)
Glucose, Bld: 123 mg/dL — ABNORMAL HIGH (ref 70–99)
Potassium: 4.3 mmol/L (ref 3.5–5.1)
Sodium: 136 mmol/L (ref 135–145)

## 2019-12-14 LAB — LIPID PANEL
Cholesterol: 203 mg/dL — ABNORMAL HIGH (ref 0–200)
HDL: 38 mg/dL — ABNORMAL LOW (ref >40)
Total CHOL/HDL Ratio: 5.3 RATIO
Triglycerides: 320 mg/dL — ABNORMAL HIGH (ref <150)
VLDL: 64 mg/dL — ABNORMAL HIGH (ref 0–40)

## 2019-12-14 LAB — CBC
HCT: 50.3 % (ref 39.0–52.0)
Hemoglobin: 17.6 g/dL — ABNORMAL HIGH (ref 13.0–17.0)
MCH: 34.2 pg — ABNORMAL HIGH (ref 26.0–34.0)
MCHC: 35 g/dL (ref 30.0–36.0)
MCV: 97.9 fL (ref 80.0–100.0)
Platelets: 314 10*3/uL (ref 150–400)
RBC: 5.14 MIL/uL (ref 4.22–5.81)
RDW: 12.1 % (ref 11.5–15.5)
WBC: 14.3 10*3/uL — ABNORMAL HIGH (ref 4.0–10.5)
nRBC: 0 % (ref 0.0–0.2)

## 2019-12-14 LAB — HEMOGLOBIN A1C
Hgb A1c MFr Bld: 5.5 % (ref 4.8–5.6)
Mean Plasma Glucose: 111.15 mg/dL

## 2019-12-14 LAB — APTT
aPTT: 42 seconds — ABNORMAL HIGH (ref 24–36)
aPTT: 44 seconds — ABNORMAL HIGH (ref 24–36)

## 2019-12-14 LAB — HEPARIN LEVEL (UNFRACTIONATED): Heparin Unfractionated: 0.26 IU/mL — ABNORMAL LOW (ref 0.30–0.70)

## 2019-12-14 LAB — TROPONIN I (HIGH SENSITIVITY)
Troponin I (High Sensitivity): >27000 ng/L (ref <18)
Troponin I (High Sensitivity): >27000 ng/L (ref <18)

## 2019-12-14 LAB — MRSA PCR SCREENING: MRSA by PCR: NEGATIVE

## 2019-12-14 MED ORDER — SODIUM CHLORIDE 0.9% FLUSH
3.0000 mL | INTRAVENOUS | Status: DC | PRN
  Administered 2019-12-16: 3 mL via INTRAVENOUS

## 2019-12-14 MED ORDER — NITROGLYCERIN IN D5W 200-5 MCG/ML-% IV SOLN: 0.0000 ug/min | INTRAVENOUS | Status: DC

## 2019-12-14 MED ORDER — ONDANSETRON HCL 4 MG/2ML IJ SOLN: 4.0000 mg | Freq: Four times a day (QID) | INTRAMUSCULAR | Status: DC | PRN

## 2019-12-14 MED ORDER — HEPARIN (PORCINE) 25000 UT/250ML-% IV SOLN
1300.0000 [IU]/h | INTRAVENOUS | Status: DC
  Administered 2019-12-14: 1000 [IU]/h via INTRAVENOUS
  Administered 2019-12-15: 1300 [IU]/h via INTRAVENOUS
  Filled 2019-12-14 (×2): qty 250

## 2019-12-14 MED ORDER — SODIUM CHLORIDE 0.9 % WEIGHT BASED INFUSION
0.2500 mL/kg/h | INTRAVENOUS | Status: AC
  Administered 2019-12-14: 0.25 mL/kg/h via INTRAVENOUS

## 2019-12-14 MED ORDER — METOPROLOL TARTRATE 12.5 MG HALF TABLET
12.5000 mg | ORAL_TABLET | Freq: Two times a day (BID) | ORAL | Status: DC
  Administered 2019-12-14 (×2): 12.5 mg via ORAL
  Filled 2019-12-14 (×3): qty 1

## 2019-12-14 MED ORDER — FUROSEMIDE 10 MG/ML IJ SOLN
40.0000 mg | Freq: Once | INTRAMUSCULAR | Status: AC
  Administered 2019-12-14: 40 mg via INTRAVENOUS
  Filled 2019-12-14: qty 4

## 2019-12-14 MED ORDER — ORAL CARE MOUTH RINSE
15.0000 mL | Freq: Two times a day (BID) | OROMUCOSAL | Status: DC
  Administered 2019-12-14 – 2019-12-15 (×3): 15 mL via OROMUCOSAL

## 2019-12-14 MED ORDER — SODIUM CHLORIDE 0.9 % IV SOLN: 250.0000 mL | INTRAVENOUS | Status: DC | PRN

## 2019-12-14 MED ORDER — CLOPIDOGREL BISULFATE 75 MG PO TABS
75.0000 mg | ORAL_TABLET | Freq: Every day | ORAL | Status: DC
  Administered 2019-12-14 – 2019-12-17 (×4): 75 mg via ORAL
  Filled 2019-12-14 (×4): qty 1

## 2019-12-14 MED ORDER — ASPIRIN 81 MG PO CHEW
81.0000 mg | CHEWABLE_TABLET | Freq: Every day | ORAL | Status: DC
  Administered 2019-12-14 – 2019-12-17 (×4): 81 mg via ORAL
  Filled 2019-12-14 (×4): qty 1

## 2019-12-14 MED ORDER — SODIUM CHLORIDE 0.9% FLUSH
3.0000 mL | Freq: Two times a day (BID) | INTRAVENOUS | Status: DC
  Administered 2019-12-14 – 2019-12-16 (×4): 3 mL via INTRAVENOUS

## 2019-12-14 MED ORDER — CHLORHEXIDINE GLUCONATE CLOTH 2 % EX PADS
6.0000 | MEDICATED_PAD | Freq: Every day | CUTANEOUS | Status: DC
  Administered 2019-12-14 – 2019-12-16 (×3): 6 via TOPICAL

## 2019-12-14 MED ORDER — PERFLUTREN LIPID MICROSPHERE
1.0000 mL | INTRAVENOUS | Status: AC | PRN
  Administered 2019-12-14: 3 mL via INTRAVENOUS
  Filled 2019-12-14: qty 10

## 2019-12-14 MED ORDER — HYDRALAZINE HCL 20 MG/ML IJ SOLN: 10.0000 mg | INTRAMUSCULAR | Status: AC | PRN

## 2019-12-14 MED ORDER — TIROFIBAN HCL IV 12.5 MG/250 ML
0.0750 ug/kg/min | INTRAVENOUS | Status: AC
  Administered 2019-12-14: 0.075 ug/kg/min via INTRAVENOUS
  Filled 2019-12-14: qty 250

## 2019-12-14 MED ORDER — ACETAMINOPHEN 325 MG PO TABS: 650.0000 mg | ORAL_TABLET | ORAL | Status: DC | PRN

## 2019-12-14 MED ORDER — METOPROLOL TARTRATE 25 MG/10 ML ORAL SUSPENSION
6.2500 mg | Freq: Four times a day (QID) | ORAL | Status: DC
  Administered 2019-12-14 (×2): 6.25 mg via ORAL
  Filled 2019-12-14 (×2): qty 5

## 2019-12-14 MED ORDER — PERFLUTREN LIPID MICROSPHERE
INTRAVENOUS | Status: AC
  Administered 2019-12-14: 2 mL
  Filled 2019-12-14: qty 10

## 2019-12-14 MED ORDER — ATORVASTATIN CALCIUM 80 MG PO TABS
80.0000 mg | ORAL_TABLET | Freq: Every day | ORAL | Status: DC
  Administered 2019-12-14 – 2019-12-17 (×4): 80 mg via ORAL
  Filled 2019-12-14 (×4): qty 1

## 2019-12-14 MED ORDER — OXYCODONE HCL 5 MG PO TABS: 5.0000 mg | ORAL_TABLET | ORAL | Status: DC | PRN

## 2019-12-14 MED ORDER — LABETALOL HCL 5 MG/ML IV SOLN: 10.0000 mg | INTRAVENOUS | Status: AC | PRN

## 2019-12-14 NOTE — Progress Notes (Signed)
  Echocardiogram 2D Echocardiogram has been performed.  Thomas Carpenter 12/14/2019, 1:13 PM

## 2019-12-14 NOTE — Progress Notes (Signed)
ANTICOAGULATION CONSULT NOTE - Initial Consult  Pharmacy Consult for Heparin  Indication: STEMI, s/p PCI, IABP in place  No Known Allergies  Patient Measurements: Height: 5\' 9"  (175.3 cm) Weight: 288 lb 1.6 oz (130.7 kg) IBW/kg (Calculated) : 70.7  Vital Signs: Temp: 98 F (36.7 C) (03/11 2130) Temp Source: Oral (03/11 2130) BP: 162/105 (03/11 2325) Pulse Rate: 86 (03/11 2146)  Labs: Recent Labs    12/13/19 2125  HGB 17.1*  HCT 49.4  PLT 287  APTT 29  LABPROT 13.0  INR 1.0  CREATININE 1.90*  TROPONINIHS 19*    Estimated Creatinine Clearance: 50.5 mL/min (A) (by C-G formula based on SCr of 1.9 mg/dL (H)).   Medical History: Past Medical History:  Diagnosis Date  . DVT (deep venous thrombosis) ([place])   . Hypertension   . Pneumonia   . Pulmonary emboli [facility])     Assessment: 69 y/o M STEMI s/p PCI now with IABP in place. On apixaban PTA for history of DVT/PE. Anticipate using aPTT to dose heparin for now.   Goal of Therapy:  Heparin level 0.2-0.5 units/ml aPTT 57-84 seconds Monitor platelets by anticoagulation protocol: Yes   Plan:  Start heparin drip at 1000 units/hr Heparin level/aPTT at 0800 Daily CBC/aPTT/HL Monitor for bleeding  Narda Bonds, PharmD, BCPS Clinical Pharmacist Phone: [phone]

## 2019-12-14 NOTE — Progress Notes (Signed)
ANTICOAGULATION CONSULT NOTE - Follow Up Consult  Pharmacy Consult for Heparin Indication: STEMI, s/p PCI, IABP in place   No Known Allergies  Patient Measurements: Height: 5\' 9"  (175.3 cm) Weight: 288 lb 1.6 oz (130.7 kg) IBW/kg (Calculated) : 70.7 Heparin Dosing Weight: 101.1 kg   Vital Signs: BP: 124/82 (03/12 1030) Pulse Rate: 79 (03/12 1030)  Labs: Recent Labs    12/13/19 2125 12/14/19 0215  HGB 17.1* 17.6*  HCT 49.4 50.3  PLT 287 314  APTT 29  --   LABPROT 13.0  --   INR 1.0  --   CREATININE 1.90* 1.81*  TROPONINIHS 19* >27,000*    Estimated Creatinine Clearance: 53 mL/min (A) (by C-G formula based on SCr of 1.81 mg/dL (H)).   Medical History: Past Medical History:  Diagnosis Date  . DVT (deep venous thrombosis) ([place])   . Hypertension   . Pneumonia   . Pulmonary emboli [facility])    Assessment: 70 yo male presented on 12/13/2019 with chest pain for 1 hour. Patient found to have STEMI s/p PCI with IABP in place. In cath found to have 100% occlusion of proximal to mid LAD - DES placed. Of note, patient is on apixaban 2.5mg  twice daily prior to admission for hx of PE/DVT (~3 years ago). Last dose of apixaban 3/11 AM.   APTT 42 is subtherapeutic (goal 57-84) on heparin 1000 units/hr and heparin level is 0.26 which appears therapeutic but likely is still falsely elevated due to apixaban. Hgb 17.6. Plt 314. Per RN, minimal oozing around sheath which appears normal.    Goal of Therapy:  Heparin level 0.2 - 0.5 units/ml while IABP in place aPTT 57 - 84 seconds  Monitor platelets by anticoagulation protocol: Yes   Plan:  Increase heparin to 1100 units/hr  Check heparin level and aPTT at 1800 Assess heparin level and aPTT for correlation  Monitor heparin level, aPTT CBC, and S/S of bleeding daily  Follow up removal of IABP and resuming of apixaban or targeting an appropriate heparin level after IABP removed if heparin continued  Henri Medal 12/14/2019,11:12 AM

## 2019-12-14 NOTE — Progress Notes (Signed)
ANTICOAGULATION CONSULT NOTE - Follow Up Consult  Pharmacy Consult for Heparin Indication: STEMI, s/p PCI, IABP in place   No Known Allergies  Patient Measurements: Height: 5\' 9"  (175.3 cm) Weight: 288 lb 1.6 oz (130.7 kg) IBW/kg (Calculated) : 70.7 Heparin Dosing Weight: 101.1 kg   Vital Signs: Temp: 98.9 F (37.2 C) (03/12 1544) Temp Source: Oral (03/12 1544) BP: 127/77 (03/12 1900) Pulse Rate: 75 (03/12 1900)  Labs: Recent Labs    12/13/19 2125 12/14/19 0215 12/14/19 0913 12/14/19 1034 12/14/19 1842  HGB 17.1* 17.6*  --   --   --   HCT 49.4 50.3  --   --   --   PLT 287 314  --   --   --   APTT 29  --   --  42* 44*  LABPROT 13.0  --   --   --   --   INR 1.0  --   --   --   --   HEPARINUNFRC  --   --   --  0.26*  --   CREATININE 1.90* 1.81*  --   --   --   TROPONINIHS 19* >27,000* >27,000*  --   --     Estimated Creatinine Clearance: 53 mL/min (A) (by C-G formula based on SCr of 1.81 mg/dL (H)).   Medical History: Past Medical History:  Diagnosis Date  . DVT (deep venous thrombosis) ([place])   . Hypertension   . Pneumonia   . Pulmonary emboli [facility])    Assessment: 69 yo male presented on 12/13/2019 with chest pain for 1 hour. Patient found to have STEMI s/p PCI with IABP in place. In cath found to have 100% occlusion of proximal to mid LAD - DES placed. Of note, patient is on apixaban 2.5mg  twice daily prior to admission for hx of PE/DVT (~3 years ago). Last dose of apixaban 3/11 AM.   APTT remains subtherapeutic at 44s. No infusion or bleeding issues noted.   Goal of Therapy:  Heparin level 0.2 - 0.5 units/ml while IABP in place aPTT 57 - 84 seconds  Monitor platelets by anticoagulation protocol: Yes   Plan:  Increase heparin to 1300 units/hr  Repeat aPTT and heparin level in ~6h  Arrie Senate, PharmD, BCPS Clinical Pharmacist [phone] Please check AMION for all [place] numbers 12/14/2019

## 2019-12-14 NOTE — Progress Notes (Addendum)
Progress Note  Patient Name: Thomas Carpenter Date of Encounter: 12/14/2019  Primary Cardiologist: new, Dr. Tamala Julian  Subjective   No recurrent chest pain started back on Eliquis  Inpatient Medications    Scheduled Meds: . aspirin  81 mg Oral Daily  . atorvastatin  80 mg Oral q1800  . Chlorhexidine Gluconate Cloth  6 each Topical Daily  . clopidogrel  75 mg Oral Q breakfast  . mouth rinse  15 mL Mouth Rinse BID  . metoprolol tartrate  6.25 mg Oral Q6H  . sodium chloride flush  3 mL Intravenous Q12H   Continuous Infusions: . sodium chloride    . sodium chloride 0.25 mL/kg/hr (12/14/19 0101)  . heparin 1,000 Units/hr (12/14/19 1000)  . tirofiban 0.075 mcg/kg/min (12/14/19 1000)   PRN Meds: sodium chloride, acetaminophen, nitroGLYCERIN, ondansetron (ZOFRAN) IV, oxyCODONE, sodium chloride flush   Vital Signs    Vitals:   12/14/19 0935 12/14/19 1000 12/14/19 1006 12/14/19 1030  BP: 136/64  (!) 156/83 124/82  Pulse: 69 74 75 79  Resp: 11 15 14 19   Temp:      TempSrc:      SpO2: 95% 98% 99% 98%  Weight:      Height:        Intake/Output Summary (Last 24 hours) at 12/14/2019 1055 Last data filed at 12/14/2019 1000 Gross per 24 hour  Intake 370.1 ml  Output 3300 ml  Net -2929.9 ml    I/O since admission: -2929  [facility] Weights   12/13/19 2117  Weight: 130.7 kg    Telemetry    Sinus rhythm in the 70s without ectopy- Personally Reviewed  ECG    12/14/2019 ECG (independently read by me): Normal sinus rhythm at 69 bpm.  Resolution of ST elevation but QS complex in L V1 through V3.  Inferior Q waves 2 3 and F.  QTc interval 445 ms.  12/13/2019 ECG (independently read by me): Normal sinus rhythm at 75 bpm, right bundle branch block, ST elevation V2 through V4  Physical Exam    BP 124/82   Pulse 79   Temp 98 F (36.7 C) (Oral)   Resp 19   Ht 5' 9"  (1.753 m)   Wt 130.7 kg   SpO2 98%   BMI 42.54 kg/m  General: Alert, oriented, no distress.  Skin: normal  turgor, no rashes, warm and dry HEENT: Normocephalic, atraumatic. Pupils equal round and reactive to light; sclera anicteric; extraocular muscles intact;  Nose without nasal septal hypertrophy Mouth/Parynx benign; Mallinpatti scale Neck: No JVD, no carotid bruits; normal carotid upstroke Lungs: clear to ausculatation and percussion; no wheezing or rales Chest wall: without tenderness to palpitation Heart: PMI not displaced, RRR, s1 s2 normal, 1/6 systolic murmur, no diastolic murmur, no rubs, gallops, thrills, or heaves Abdomen: soft, nontender; no hepatosplenomehaly, BS+; abdominal aorta nontender and not dilated by palpation. Back: no CVA tenderness Pulses 2+; intra-aortic balloon pump in place right femoral artery, no hematoma or ecchymoses. Musculoskeletal: full range of motion, normal strength, no joint deformities Extremities: no clubbing cyanosis or edema, Homan's sign negative  Neurologic: grossly nonfocal; Cranial nerves grossly wnl Psychologic: Normal mood and affect   Labs    Chemistry Recent Labs  Lab 12/13/19 2125 12/14/19 0215  NA 136 136  K 3.6 4.3  CL 102 100  CO2 22 21*  GLUCOSE 132* 123*  BUN 29* 31*  CREATININE 1.90* 1.81*  CALCIUM 9.1 9.2  PROT 7.3  --   ALBUMIN 3.7  --  AST 31  --   ALT 27  --   ALKPHOS 70  --   BILITOT 0.6  --   GFRNONAA 36* 38*  GFRAA 41* 44*  ANIONGAP 12 15     Hematology Recent Labs  Lab 12/13/19 2125 12/14/19 0215  WBC 12.7* 14.3*  RBC 4.91 5.14  HGB 17.1* 17.6*  HCT 49.4 50.3  MCV 100.6* 97.9  MCH 34.8* 34.2*  MCHC 34.6 35.0  RDW 12.3 12.1  PLT 287 314    Cardiac EnzymesNo results for input(s): TROPONINI in the last 168 hours. No results for input(s): TROPIPOC in the last 168 hours.   BNPNo results for input(s): BNP, PROBNP in the last 168 hours.   DDimer No results for input(s): DDIMER in the last 168 hours.   Lipid Panel     Component Value Date/Time   CHOL PENDING 12/13/2019 2125   TRIG PENDING  12/13/2019 2125   HDL PENDING 12/13/2019 2125   CHOLHDL PENDING 12/13/2019 2125   VLDL PENDING 12/13/2019 2125   [place] NOT CALCULATED 12/13/2019 2125     Radiology    CARDIAC CATHETERIZATION  Result Date: 12/14/2019  A stent was successfully placed.  A stent was successfully placed.   Anteroseptal myocardial infarction 12 occlusion of the proximal LAD.  Successful PCI with overlapping stent from ostial LAD to mid vessel using 3.0, 26 and 34 mm long Onyx stents deployed at 14 atm.  0% with initial TIMI grade II flow that improved to TIMI grade III after intracoronary nitroglycerin.  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  Widely patent left main.  Widely patent ramus and circumflex.  Acute on chronic combined systolic and diastolic heart failure with LVEDP 34 mmHg.  Estimated ejection fraction 40%.  Intra-aortic balloon pump placed post stenting because of developing pulmonary edema and high left ventricular filling pressures. RECOMMENDATIONS:  Intra-aortic balloon pump at one-to-one.  IV nitroglycerin  IV heparin  Continue beta-blocker therapy but at lower dose  High intensity statin therapy  Diuretic therapy as needed to control pulmonary edema  Supplemental oxygen potentially escalating to BiPAP if needed for oxygenation  Guarded prognosis from the standpoint of heart failure and CKD stage IV.  The patient received 160 cc of contrast during a complicated procedure.   DG Chest Port 1 View  Result Date: 12/13/2019 CLINICAL DATA:  Chest pain for 1 hour EXAM: PORTABLE CHEST 1 VIEW COMPARISON:  None. FINDINGS: No pleural effusion or focal consolidation. Borderline cardiomegaly. Mild central vascular congestion. Diffuse interstitial opacity. No pneumothorax. IMPRESSION: Borderline cardiomegaly with mild central vascular congestion. Diffuse interstitial opacity, could reflect mild interstitial edema. Electronically Signed   By: Donavan Foil M.D.   On:  12/13/2019 21:39    Cardiac Studies    A stent was successfully placed.  A stent was successfully placed.    Anteroseptal myocardial infarction 12 occlusion of the proximal LAD.  Successful PCI with overlapping stent from ostial LAD to mid vessel using 3.0, 26 and 34 mm long Onyx stents deployed at 14 atm.  0% with initial TIMI grade II flow that improved to TIMI grade III after intracoronary nitroglycerin.  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  Widely patent left main.  Widely patent ramus and circumflex.  Acute on chronic combined systolic and diastolic heart failure with LVEDP 34 mmHg.  Estimated ejection fraction 40%.  Intra-aortic balloon pump placed post stenting because of developing pulmonary edema and high left ventricular filling pressures.  RECOMMENDATIONS:  Intra-aortic balloon pump at one-to-one.  IV nitroglycerin  IV heparin  Continue beta-blocker therapy but at lower dose  High intensity statin therapy  Diuretic therapy as needed to control pulmonary edema  Supplemental oxygen potentially escalating to BiPAP if needed for oxygenation  Guarded prognosis from the standpoint of heart failure and CKD stage IV.  The patient received 160 cc of contrast during a complicated procedure.   Intervention      Patient Profile     Conn Trombetta is a 69 y.o. 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    Assessment & Plan    1.  Day 1 status post anterior ST segment elevation myocardial infarction: Emergent cardiac catheterization angiograms reviewed.  Total very proximal LAD occlusion successfully stented.  Patient has probably old distal RCA occlusion with collateralization via the ramus intermediate vessel.  LVEDP 34 mmHg.  Estimated EF 40%.  Hemodynamic support intra-aortic balloon pump placed post stenting because of development of pulmonary edema and high left  ventricular filling pressures.  IABP setting, currently 1:1.  I have changed this to 1:2 with diastolic augmentation approximately 144 mmHg.  Since patient was on long-term anticoagulation therapy with remote history of DVT/PE and will require anticoagulation, DAPT therapy currently with aspirin/Plavix.  Should continue triple drug therapy for 1 month and then change to Plavix/Eliquis.  He continues to be on Aggrastat will DC after 18-hour infusion is complete.  He is on heparin with balloon pump in place.  Bring her upstairs  2.  CHF: Pulmonary edema post procedure.  Will repeat chest x-ray today.  Continue intra-aortic balloon pump today.  Plan to obtain 2D echo Doppler study to assess systolic and diastolic function.  Received Lasix 40 mg last evening and again at 4 AM.  3.  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  Apparently was only on low-dose Eliquis.  4.  History of hypertension: Patient was on spironolactone 25 mg daily.  Patient at home has been on metoprolol tartrate 50 mg twice a day.  Will resume at 12.5 mg twice a day.    5.  CKD: Creatinine 1.90 > 1.81.  6.  Hyperlipidemia: We will start atorvastatin 80 mg.  Lipid panel pending.    CC time spent: 40 minutes  Signed, Troy Sine, MD, [facility] 12/14/2019, 10:55 AM

## 2019-12-14 NOTE — Progress Notes (Signed)
CRITICAL VALUE ALERT  Critical Value: Troponin greater than 27,000  Date & Time Notied: 12/14/2019, 2641  Provider Notified: On call cardiologist. 12/14/2019, [place]  Orders Received/Actions taken: Cardiologist called RN. No orders received at this time.

## 2019-12-15 ENCOUNTER — Inpatient Hospital Stay (HOSPITAL_COMMUNITY): Payer: Medicare Other

## 2019-12-15 LAB — BASIC METABOLIC PANEL
Anion gap: 11 (ref 5–15)
BUN: 18 mg/dL (ref 8–23)
CO2: 24 mmol/L (ref 22–32)
Calcium: 8.5 mg/dL — ABNORMAL LOW (ref 8.9–10.3)
Chloride: 99 mmol/L (ref 98–111)
Creatinine, Ser: 1.3 mg/dL — ABNORMAL HIGH (ref 0.61–1.24)
GFR calc Af Amer: >60 mL/min (ref >60)
GFR calc non Af Amer: 56 mL/min — ABNORMAL LOW (ref >60)
Glucose, Bld: 99 mg/dL (ref 70–99)
Potassium: 4.6 mmol/L (ref 3.5–5.1)
Sodium: 134 mmol/L — ABNORMAL LOW (ref 135–145)

## 2019-12-15 LAB — CBC
HCT: 47.2 % (ref 39.0–52.0)
Hemoglobin: 16.3 g/dL (ref 13.0–17.0)
MCH: 33.7 pg (ref 26.0–34.0)
MCHC: 34.5 g/dL (ref 30.0–36.0)
MCV: 97.5 fL (ref 80.0–100.0)
Platelets: 227 10*3/uL (ref 150–400)
RBC: 4.84 MIL/uL (ref 4.22–5.81)
RDW: 12.2 % (ref 11.5–15.5)
WBC: 9.8 10*3/uL (ref 4.0–10.5)
nRBC: 0 % (ref 0.0–0.2)

## 2019-12-15 LAB — HEPARIN LEVEL (UNFRACTIONATED)
Heparin Unfractionated: 0.22 IU/mL — ABNORMAL LOW (ref 0.30–0.70)
Heparin Unfractionated: 0.26 IU/mL — ABNORMAL LOW (ref 0.30–0.70)

## 2019-12-15 LAB — APTT
aPTT: 58 seconds — ABNORMAL HIGH (ref 24–36)
aPTT: 58 seconds — ABNORMAL HIGH (ref 24–36)

## 2019-12-15 LAB — POCT ACTIVATED CLOTTING TIME: Activated Clotting Time: 136 seconds

## 2019-12-15 MED ORDER — FUROSEMIDE 10 MG/ML IJ SOLN
40.0000 mg | Freq: Once | INTRAMUSCULAR | Status: AC
  Administered 2019-12-15: 40 mg via INTRAVENOUS
  Filled 2019-12-15: qty 4

## 2019-12-15 MED ORDER — APIXABAN 2.5 MG PO TABS
2.5000 mg | ORAL_TABLET | Freq: Two times a day (BID) | ORAL | Status: DC
  Administered 2019-12-15 – 2019-12-17 (×4): 2.5 mg via ORAL
  Filled 2019-12-15 (×4): qty 1

## 2019-12-15 MED ORDER — DIGOXIN 125 MCG PO TABS
0.1250 mg | ORAL_TABLET | Freq: Every day | ORAL | Status: DC
  Administered 2019-12-15 – 2019-12-17 (×3): 0.125 mg via ORAL
  Filled 2019-12-15 (×3): qty 1

## 2019-12-15 NOTE — Progress Notes (Addendum)
ANTICOAGULATION CONSULT NOTE - Follow Up Consult  Pharmacy Consult for Heparin > resume Eliquis Indication: STEMI, s/p PCI, IABP > pulling today  No Known Allergies  Patient Measurements: Height: 5\' 9"  (175.3 cm) Weight: 277 lb 12.5 oz (126 kg) IBW/kg (Calculated) : 70.7 Heparin Dosing Weight: 101.1 kg   Vital Signs: Temp: 98.1 F (36.7 C) (03/13 1200) Temp Source: Oral (03/13 1200) BP: 86/68 (03/13 1400) Pulse Rate: 91 (03/13 1400)  Labs: Recent Labs    12/13/19 2125 12/13/19 2125 12/14/19 0215 12/14/19 0913 12/14/19 1034 12/14/19 1034 12/14/19 1842 12/15/19 0324 12/15/19 1056  HGB 17.1*   < > 17.6*  --   --   --   --  16.3  --   HCT 49.4  --  50.3  --   --   --   --  47.2  --   PLT 287  --  314  --   --   --   --  227  --   APTT 29   < >  --   --  42*   < > 44* 58* 58*  LABPROT 13.0  --   --   --   --   --   --   --   --   INR 1.0  --   --   --   --   --   --   --   --   HEPARINUNFRC  --   --   --   --  0.26*  --   --  0.22* 0.26*  CREATININE 1.90*  --  1.81*  --   --   --   --   --  1.30*  TROPONINIHS 19*  --  >27,000* >27,000*  --   --   --   --   --    < > = values in this interval not displayed.    Estimated Creatinine Clearance: 72.4 mL/min (A) (by C-G formula based on SCr of 1.3 mg/dL (H)).   Medical History: Past Medical History:  Diagnosis Date  . DVT (deep venous thrombosis) ([place])   . Hypertension   . Pneumonia   . Pulmonary emboli [facility])    Assessment: 69 yo male presented on 12/13/2019 with chest pain for 1 hour. Patient found to have STEMI s/p PCI with IABP in place. In cath found to have 100% occlusion of proximal to mid LAD - DES placed. Of note, patient is on apixaban 2.5mg  twice daily prior to admission for hx of PE/DVT (~3 years ago). Last dose of apixaban 3/11 AM.   APTT and heparin level at goal this AM on IABP.  IABP now being pulled.    Goal of Therapy:  Monitor platelets by anticoagulation protocol: Yes   Plan:  Discussed with Dr.  Haroldine Laws, will resume Apixaban 2.5 mg BID. Per Dr. Evette Georges note, planning triple rx (Plavix + Apixaban + ASA) x 1 month, then drop ASA.  Marguerite Olea, [facility] Clinical Pharmacist Phone [phone]  12/15/2019 2:18 PM

## 2019-12-15 NOTE — Consult Note (Signed)
Advanced Heart Failure Team Consult Note   Primary Physician: System, Pcp Not In PCP-Cardiologist:  No primary care provider on file.  Reason for Consultation: Acute systolic HF/shock  HPI:    Thomas Carpenter is seen today for evaluation of acute systolic HF/shock at the request of Dr. Debara Pickett.   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  Emergent cardiac catheterization showed a proximally occluded LAD is distal occlusion of the RCA with left-to-right collaterals.  Ejection fraction was estimated at 35-45 ventriculogram.  Post catheterization he developed acute respiratory distress with an EDP of 36 and an IABP was inserted.  Peak high-sensitivity troponin was greater than 27,000.  Echocardiogram on 3/12: Showed an EF of 25 to 30%.  The RV was normal.  This morning he remains on IABP at 1-2.  He denies chest pain or shortness of breath.  Systolic blood pressures are soft at 100-110.     Review of Systems: [y] = yes, [ ]  = no   . General: Weight gain [ ] ; Weight loss [ ] ; Anorexia [ ] ; Fatigue [ ] ; Fever [ ] ; Chills [ ] ; Weakness [ ]   . Cardiac: Chest pain/pressure [ y]; Resting SOB [ ] ; Exertional SOB [ ] ; Orthopnea [ ] ; Pedal Edema [ ] ; Palpitations [ ] ; Syncope [ ] ; Presyncope [ ] ; Paroxysmal nocturnal dyspnea[ ]   . Pulmonary: Cough [ ] ; Wheezing[ ] ; Hemoptysis[ ] ; Sputum [ ] ; Snoring [ ]   . GI: Vomiting[ ] ; Dysphagia[ ] ; Melena[ ] ; Hematochezia [ ] ; Heartburn[ ] ; Abdominal pain [ ] ; Constipation [ ] ; Diarrhea [ ] ; BRBPR [ ]   . GU: Hematuria[ ] ; Dysuria [ ] ; Nocturia[ ]   . Vascular: Pain in legs with walking [ ] ; Pain in feet with lying flat [ ] ; Non-healing sores [ ] ; Stroke [ ] ; TIA [ ] ; Slurred speech [ ] ;  . Neuro: Headaches[ ] ; Vertigo[ ] ; Seizures[ ] ; Paresthesias[ ] ;Blurred vision [ ] ; Diplopia [ ] ; Vision changes [ ]    . Ortho/Skin: Arthritis Blue.Reese ]; Joint pain Blue.Reese ]; Muscle pain [ ] ; Joint swelling [ ] ; Back Pain [ ] ; Rash [ ]   . Psych: Depression[ ] ; Anxiety[ ]   . Heme: Bleeding problems [ ] ; Clotting disorders [ ] ; Anemia [ ]   . Endocrine: Diabetes [ ] ; Thyroid dysfunction[ ]   Home Medications Prior to Admission medications   Medication Sig Start Date End Date Taking? Authorizing Provider  Cholecalciferol (VITAMIN D3) 125 MCG (5000 UT) CAPS Take 5,000 Units by mouth daily with breakfast.   Yes [provider]  Cyanocobalamin (VITAMIN B-12 PO) Take 1 tablet by mouth daily with breakfast.   Yes [provider]  ELIQUIS 2.5 MG TABS tablet Take 2.5 mg by mouth 2 (two) times daily. 10/08/19  Yes [provider]  metoprolol tartrate (LOPRESSOR) 50 MG tablet Take 50 mg by mouth 2 (two) times daily. 11/18/19  Yes [provider]  spironolactone (ALDACTONE) 25 MG tablet Take 25 mg by mouth every morning. 10/03/19  Yes [provider]    Past Medical History: Past Medical History:  Diagnosis Date  . DVT (deep venous thrombosis) ([place])   . Hypertension   . Pneumonia   . Pulmonary emboli [facility])     Past Surgical History: Past Surgical History:  Procedure Laterality Date  . CORONARY STENT INTERVENTION N/A 12/13/2019   Procedure: CORONARY STENT INTERVENTION;  Surgeon:  Belva Crome, MD;  Location: [facility];  Service: Cardiovascular;  Laterality: N/A;  . CORONARY/GRAFT ACUTE MI REVASCULARIZATION N/A 12/13/2019   Procedure: Coronary/Graft Acute MI Revascularization;  Surgeon: Belva Crome, MD;  Location: [facility];  Service: Cardiovascular;  Laterality: N/A;  . IABP INSERTION N/A 12/13/2019   Procedure: IABP Insertion;  Surgeon: Belva Crome, MD;  Location: [facility];  Service: Cardiovascular;  Laterality: N/A;  . LEFT HEART CATH AND CORONARY ANGIOGRAPHY N/A 12/13/2019   Procedure: LEFT HEART CATH AND CORONARY ANGIOGRAPHY;  Surgeon: Belva Crome, MD;  Location: [facility];  Service: Cardiovascular;  Laterality: N/A;    Family History: Denies family history of sudden cardiac death or premature heart failure.  Social History: Social History   Socioeconomic History  . Marital status: Single    Spouse name: Not on file  . Number of children: Not on file  . Years of education: Not on file  . Highest education level: Not on file  Occupational History  . Not on file  Tobacco Use  . Smoking status: Never Smoker  . Smokeless tobacco: Current User  Substance and Sexual Activity  . Alcohol use: Yes    Comment: daily  . Drug use: Never  . Sexual activity: Not on file  Other Topics Concern  . Not on file  Social History Narrative  . Not on file   Social Determinants of Health   Financial Resource Strain:   . Difficulty of Paying Living Expenses:   Food Insecurity:   . Worried About Charity fundraiser in the Last Year:   . Arboriculturist in the Last Year:   Transportation Needs:   . Film/video editor (Medical):   Marland Kitchen Lack of Transportation (Non-Medical):   Physical Activity:   . Days of Exercise per Week:   . Minutes of Exercise per Session:   Stress:   . Feeling of Stress :   Social Connections:   . Frequency of Communication with Friends and Family:   . Frequency of Social Gatherings with Friends and Family:   . Attends Religious Services:   . Active Member of Clubs or Organizations:   . Attends Archivist Meetings:   Marland Kitchen Marital Status:     Allergies:  No Known Allergies  Objective:    Vital Signs:   Temp:  [97.8 F (36.6 C)-98.9 F (37.2 C)] 98.1 F (36.7 C) (03/13 1200) Pulse Rate:  [32-96] 91 (03/13 1400) Resp:  [12-31] 21 (03/13 1400) BP: (86-138)/(55-87) 86/68 (03/13 1400) SpO2:  [92 %-100 %] 97 % (03/13 1400) Weight:  [126 kg] 126 kg (03/13 0330) Last BM Date: 12/15/19  Weight change: Filed Weights   12/13/19 2117 12/15/19 0330  Weight: 130.7 kg 126 kg     Intake/Output:   Intake/Output Summary (Last 24 hours) at 12/15/2019 1419 Last data filed at 12/15/2019 1300 Gross per 24 hour  Intake 1070.09 ml  Output 3425 ml  Net -2354.91 ml      Physical Exam    General: Obese male lying in bed. No resp difficulty HEENT: normal Neck: supple. JVP to jaw. Carotids 2+ bilat; no bruits. No lymphadenopathy or thyromegaly appreciated. Cor: PMI nondisplaced. Regular rate & rhythm. No rubs, gallops or murmurs. Lungs: clear Abdomen: soft, nontender, nondistended. No hepatosplenomegaly. No bruits or masses. Good bowel sounds. Extremities: no cyanosis, clubbing, rash, trace to 1+ edema.  IABP site in right groin looks good.  No hematoma. Neuro: alert & orientedx3, cranial nerves grossly intact. moves all 4 extremities w/o difficulty. Affect pleasant   Telemetry   Sinus rhythm in the 70s.  Personally reviewed.  EKG    Sinus rhythm anterior Q waves and diffuse deep inferior and anterior lateral T wave inversions personally reviewed.  Labs   Basic Metabolic Panel: Recent Labs  Lab 12/13/19 2124-01-16 12/14/19 0215 12/15/19 1056  NA 136 136 134*  K 3.6 4.3 4.6  CL 102 100 99  CO2 22 21* 24  GLUCOSE 132* 123* 99  BUN 29* 31* 18  CREATININE 1.90* 1.81* 1.30*  CALCIUM 9.1 9.2 8.5*    Liver Function Tests: Recent Labs  Lab 12/13/19 16-Jan-2124  AST 31  ALT 27  ALKPHOS 70  BILITOT 0.6  PROT 7.3  ALBUMIN 3.7   No results for input(s): LIPASE, AMYLASE in the last 168 hours. No results for input(s): AMMONIA in the last 168 hours.  CBC: Recent Labs  Lab 12/13/19 January 16, 2124 12/14/19 0215 12/15/19 0324  WBC 12.7* 14.3* 9.8  NEUTROABS 5.9  --   --   HGB 17.1* 17.6* 16.3  HCT 49.4 50.3 47.2  MCV 100.6* 97.9 97.5  PLT 287 314 227    Cardiac Enzymes: No results for input(s): CKTOTAL, CKMB, CKMBINDEX, TROPONINI in the last 168 hours.  BNP: BNP (last 3 results) No results for input(s): BNP in the last 8760 hours.  ProBNP (last 3  results) No results for input(s): PROBNP in the last 8760 hours.   CBG: No results for input(s): GLUCAP in the last 168 hours.  Coagulation Studies: Recent Labs    12/13/19 01-16-2124  LABPROT 13.0  INR 1.0     Imaging   DG CHEST PORT 1 VIEW  Result Date: 12/15/2019 CLINICAL DATA:  Intra-aortic balloon pump.  Follow-up exam. EXAM: PORTABLE CHEST 1 VIEW COMPARISON:  12/14/2019 FINDINGS: Metal tip of the intra-aortic balloon pump projects over the aortic knob, well positioned. Cardiac silhouette is normal in size. No mediastinal or hilar masses. Lungs show prominent bronchovascular markings, but are otherwise clear. No visualized pleural effusion and no pneumothorax. IMPRESSION: 1. Well-positioned intra-aortic balloon pump. 2. No acute cardiopulmonary disease. Electronically Signed   By: Lajean Manes M.D.   On: 12/15/2019 09:00      Medications:     Current Medications: . apixaban  2.5 mg Oral BID  . aspirin  81 mg Oral Daily  . atorvastatin  80 mg Oral q1800  . Chlorhexidine Gluconate Cloth  6 each Topical Daily  . clopidogrel  75 mg Oral Q breakfast  . digoxin  0.125 mg Oral Daily  . mouth rinse  15 mL Mouth Rinse BID  . metoprolol tartrate  12.5 mg Oral BID  . sodium chloride flush  3 mL Intravenous Q12H     Infusions: . sodium chloride    . nitroGLYCERIN Stopped (12/15/19 0235)      Assessment/Plan   1.  CAD with acute anterior myocardial infarction - Cath 3/12 with proximal occlusion of the LAD and distal RCA occlusion with left-to-right collaterals thought to be chronic.  Status post emergent PCI of the LAD. - Hs troponin > 27,000 -Continue DAPT and statin.  He is tolerating low-dose beta-blocker. -Has been on low-dose apixaban for remote history of DVT/PE.  Once balloon pump is out we will resume low-dose apixaban and he will be on triple therapy for 1 month.  Can drop aspirin after 30 days.  Discussed with Pharm.D. personally  2. 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   - Echo 12/14/19 EF of 25 to 30%. - IABP currently on 1:2.  He has no central access to check coox and CVP.  Exam appears volume overloaded.  I will give 1 dose IV Lasix and wean IABP today. -Start digoxin, spironolactone  -Possible addition of low-dose losartan as blood pressure tolerates -Consider SGLT2 inhibitor.  3.  Acute hypoxic respiratory failure post cardiac catheterization -Due to acute pulmonary edema. -Now resolved.  Continue diuresis as above.  4.  Remote history of DVT/PE -Anticoagulation as above.  5.  AKI. -Creatinine 1.9 on admission.  Now down to 1.3.   CRITICAL CARE Performed by: Thomas Carpenter  Total critical care time: 45 minutes  Critical care time was exclusive of separately billable procedures and treating other patients.  Critical care was necessary to treat or prevent imminent or life-threatening deterioration.  Critical care was time spent personally by me (independent of midlevel providers or residents) on the following activities: development of treatment plan with patient and/or surrogate as well as nursing, discussions with consultants, evaluation of patient's response to treatment, examination of patient, obtaining history from patient or surrogate, ordering and performing treatments and interventions, ordering and review of laboratory studies, ordering and review of radiographic studies, pulse oximetry and re-evaluation of patient's condition.   Length of Stay: 1  Thomas Bickers, MD  12/15/2019, 2:19 PM  Advanced Heart Failure Team Pager [phone] (M-F; [place])  Please contact [facility] for night-coverage after hours (4p -7a ) and weekends on [email or web address]

## 2019-12-15 NOTE — Plan of Care (Signed)
Stable on IABP with 1:2 trigger. Heparin and NTG (10 mcg/min) gtts continue. Denies CP. Following mobility restrictions with femoral IABP.   Problem: Education: Goal: Understanding of CV disease, CV risk reduction, and recovery process will improve 12/15/2019 0146 by Clerance Lav, RN Outcome: Progressing 12/15/2019 0031 by Clerance Lav, RN Outcome: Progressing   Problem: Cardiovascular: Goal: Ability to achieve and maintain adequate cardiovascular perfusion will improve 12/15/2019 0146 by Clerance Lav, RN Outcome: Progressing 12/15/2019 0031 by Clerance Lav, RN Outcome: Progressing Goal: Vascular access site(s) Level 0-1 will be maintained 12/15/2019 0146 by Clerance Lav, RN Outcome: Progressing 12/15/2019 0031 by Clerance Lav, RN Outcome: Progressing   Problem: Health Behavior/Discharge Planning: Goal: Ability to safely manage health-related needs after discharge will improve 12/15/2019 0146 by Clerance Lav, RN Outcome: Progressing 12/15/2019 0031 by Clerance Lav, RN Outcome: Progressing   Problem: Cardiac: Goal: Ability to achieve and maintain adequate cardiopulmonary perfusion will improve 12/15/2019 0146 by Clerance Lav, RN Outcome: Progressing 12/15/2019 0031 by Clerance Lav, RN Outcome: Progressing   Problem: Education: Goal: Knowledge of General Education information will improve Description: Including pain rating scale, medication(s)/side effects and non-pharmacologic comfort measures 12/15/2019 0146 by Clerance Lav, RN Outcome: Progressing 12/15/2019 0031 by Clerance Lav, RN Outcome: Progressing   Problem: Clinical Measurements: Goal: Will remain free from infection Outcome: Progressing Goal: Respiratory complications will improve Outcome: Progressing Goal: Cardiovascular complication will be avoided Outcome: Progressing   Problem: Coping: Goal: Level of anxiety will decrease Outcome: Progressing   Problem:  Elimination: Goal: Will not experience complications related to urinary retention Outcome: Progressing   Problem: Pain Managment: Goal: General experience of comfort will improve Outcome: Progressing   Problem: Safety: Goal: Ability to remain free from injury will improve Outcome: Progressing

## 2019-12-15 NOTE — Plan of Care (Signed)
  Problem: Education: Goal: Understanding of CV disease, CV risk reduction, and recovery process will improve Outcome: Progressing   Problem: Cardiovascular: Goal: Vascular access site(s) Level 0-1 will be maintained Outcome: Progressing   Problem: Cardiac: Goal: Ability to achieve and maintain adequate cardiopulmonary perfusion will improve Outcome: Progressing   Problem: Education: Goal: Knowledge of General Education information will improve Description: Including pain rating scale, medication(s)/side effects and non-pharmacologic comfort measures Outcome: Progressing   Problem: Health Behavior/Discharge Planning: Goal: Ability to manage health-related needs will improve Outcome: Progressing   Problem: Clinical Measurements: Goal: Ability to maintain clinical measurements within normal limits will improve Outcome: Progressing Goal: Respiratory complications will improve Outcome: Progressing Goal: Cardiovascular complication will be avoided Outcome: Progressing   Problem: Nutrition: Goal: Adequate nutrition will be maintained Outcome: Progressing   Problem: Coping: Goal: Level of anxiety will decrease Outcome: Progressing   Problem: Elimination: Goal: Will not experience complications related to bowel motility Outcome: Progressing Goal: Will not experience complications related to urinary retention Outcome: Adequate for Discharge   Problem: Pain Managment: Goal: General experience of comfort will improve Outcome: Adequate for Discharge   Problem: Safety: Goal: Ability to remain free from injury will improve Outcome: Progressing   Problem: Skin Integrity: Goal: Risk for impaired skin integrity will decrease Outcome: Progressing   Problem: Education: Goal: Understanding of cardiac disease, CV risk reduction, and recovery process will improve Outcome: Not Progressing Goal: Understanding of medication regimen will improve Outcome: Not Progressing   Problem:  Cardiac: Goal: Ability to achieve and maintain adequate cardiopulmonary perfusion will improve Outcome: Progressing Goal: Vascular access site(s) Level 0-1 will be maintained Outcome: Progressing

## 2019-12-15 NOTE — Progress Notes (Signed)
DAILY PROGRESS NOTE   Patient Name: Haile Bosler Date of Encounter: 12/15/2019 Cardiologist: No primary care provider on file.  Chief Complaint   No chest pain  Patient Profile   Ethon Wymer is a 69 y.o. 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  Subjective   Seen yesterday by Dr. Claiborne Billings, although note not signed. Had been restarted back on heparin for history of DVT/PE and IABP. Was placed on a IABP which was weaned to 1:2 yesterday. Had PCI to proximal LAD and noted to have high filling pressures. Was on aggrestat for 18 hrs. Troponin >27000. Echo yesterday showed LVEF 25-30%, anteroseptal severe akinesis. Excellent urine output yesterday, -2L, now 4.5L negative. CXR today shows well-positioned IABP with no acute cardiopulmonary disease. Creatinine improving with diuresis (1.81 from 1.9). Weight down 4 kg.  Objective   Vitals:   12/15/19 0630 12/15/19 0700 12/15/19 0800 12/15/19 0900  BP: 104/65 97/68 (!) 112/57 127/60  Pulse: 96 (!) 32 74 (!) 35  Resp: 15 14 (!) 21 (!) 21  Temp:   97.8 F (36.6 C)   TempSrc:   Oral   SpO2: 95% 96% 92% 97%  Weight:      Height:        Intake/Output Summary (Last 24 hours) at 12/15/2019 8592 Last data filed at 12/15/2019 0900 Gross per 24 hour  Intake 972.61 ml  Output 2550 ml  Net -1577.39 ml   Filed Weights   12/13/19 2117 12/15/19 0330  Weight: 130.7 kg 126 kg    Physical Exam   General appearance: alert and no distress Neck: no carotid bruit, no JVD and thyroid not enlarged, symmetric, no tenderness/mass/nodules Lungs: clear to auscultation bilaterally Heart: regular rate and rhythm, S1, S2 normal, no murmur, click, rub or gallop and IABP sounds Abdomen: soft, non-tender; bowel sounds normal; no masses,  no organomegaly Extremities: extremities normal, atraumatic, no cyanosis or edema and good distal pulses, WWP Pulses: 2+ and symmetric Skin: Skin color, texture, turgor  normal. No rashes or lesions Neurologic: Grossly normal Psych: Pleasant, poor insight  Inpatient Medications    Scheduled Meds: . aspirin  81 mg Oral Daily  . atorvastatin  80 mg Oral q1800  . Chlorhexidine Gluconate Cloth  6 each Topical Daily  . clopidogrel  75 mg Oral Q breakfast  . mouth rinse  15 mL Mouth Rinse BID  . metoprolol tartrate  12.5 mg Oral BID  . sodium chloride flush  3 mL Intravenous Q12H    Continuous Infusions: . sodium chloride    . heparin 1,300 Units/hr (12/15/19 0900)  . nitroGLYCERIN Stopped (12/15/19 0235)    PRN Meds: sodium chloride, acetaminophen, nitroGLYCERIN, ondansetron (ZOFRAN) IV, oxyCODONE, sodium chloride flush   Labs   Results for orders placed or performed during the hospital encounter of 12/13/19 (from the past 48 hour(s))  Hemoglobin A1c     Status: None   Collection Time: 12/13/19  9:25 PM  Result Value Ref Range   Hgb A1c MFr Bld 5.5 4.8 - 5.6 %    Comment: (NOTE) Pre diabetes:          5.7%-6.4% Diabetes:              >6.4% Glycemic control for   <7.0% adults with diabetes    Mean Plasma Glucose 111.15 mg/dL    Comment: Performed at [facility], [place] [address] [place], [place] [address]  CBC with Differential/Platelet  Status: Abnormal   Collection Time: 12/13/19  9:25 PM  Result Value Ref Range   WBC 12.7 (H) 4.0 - 10.5 K/uL   RBC 4.91 4.22 - 5.81 MIL/uL   Hemoglobin 17.1 (H) 13.0 - 17.0 g/dL   HCT 49.4 39.0 - 52.0 %   MCV 100.6 (H) 80.0 - 100.0 fL   MCH 34.8 (H) 26.0 - 34.0 pg   MCHC 34.6 30.0 - 36.0 g/dL   RDW 12.3 11.5 - 15.5 %   Platelets 287 150 - 400 K/uL   nRBC 0.0 0.0 - 0.2 %   Neutrophils Relative % 46 %   Neutro Abs 5.9 1.7 - 7.7 K/uL   Lymphocytes Relative 37 %   Lymphs Abs 4.7 (H) 0.7 - 4.0 K/uL   Monocytes Relative 12 %   Monocytes Absolute 1.5 (H) 0.1 - 1.0 K/uL   Eosinophils Relative 4 %   Eosinophils Absolute 0.5 0.0 - 0.5 K/uL   Basophils Relative 1 %   Basophils Absolute 0.1 0.0 -  0.1 K/uL   Immature Granulocytes 0 %   Abs Immature Granulocytes 0.05 0.00 - 0.07 K/uL    Comment: Performed at [facility], [address] [place], [place] [address]  Protime-INR     Status: None   Collection Time: 12/13/19  9:25 PM  Result Value Ref Range   Prothrombin Time 13.0 11.4 - 15.2 seconds   INR 1.0 0.8 - 1.2    Comment: (NOTE) INR goal varies based on device and disease states. Performed at [facility], [address] [place], [place] [address]   APTT     Status: None   Collection Time: 12/13/19  9:25 PM  Result Value Ref Range   aPTT 29 24 - 36 seconds    Comment: Performed at [facility], [address] [place], [place] [address]  Comprehensive metabolic panel     Status: Abnormal   Collection Time: 12/13/19  9:25 PM  Result Value Ref Range   Sodium 136 135 - 145 mmol/L   Potassium 3.6 3.5 - 5.1 mmol/L   Chloride 102 98 - 111 mmol/L   CO2 22 22 - 32 mmol/L   Glucose, Bld 132 (H) 70 - 99 mg/dL    Comment: Glucose reference range applies only to samples taken after fasting for at least 8 hours.   BUN 29 (H) 8 - 23 mg/dL   Creatinine, Ser 1.90 (H) 0.61 - 1.24 mg/dL   Calcium 9.1 8.9 - 10.3 mg/dL   Total Protein 7.3 6.5 - 8.1 g/dL   Albumin 3.7 3.5 - 5.0 g/dL   AST 31 15 - 41 U/L   ALT 27 0 - 44 U/L   Alkaline Phosphatase 70 38 - 126 U/L   Total Bilirubin 0.6 0.3 - 1.2 mg/dL   GFR calc non Af Amer 36 (L) >60 mL/min   GFR calc Af Amer 41 (L) >60 mL/min   Anion gap 12 5 - 15    Comment: Performed at [facility], [address] [place], [place] [address]  Troponin I (High Sensitivity)     Status: Abnormal   Collection Time: 12/13/19  9:25 PM  Result Value Ref Range   Troponin I (High Sensitivity) 19 (H) <18 ng/L    Comment: (NOTE) Elevated high sensitivity troponin I (hsTnI) values and significant  changes across serial measurements may suggest ACS but many other  chronic and acute conditions are known  to  elevate hsTnI results.  Refer to the "Links" section for chest pain algorithms and additional  guidance. Performed at [facility], [address] [place], [place] [address]   Lipid panel     Status: Abnormal   Collection Time: 12/13/19  9:25 PM  Result Value Ref Range   Cholesterol 203 (H) 0 - 200 mg/dL    Comment: Performed at [place] [address], [place], [place] [address]   Triglycerides 320 (H) <150 mg/dL    Comment: Performed at [place] [address] [place], [place] [address]   HDL 38 (L) >40 mg/dL    Comment: Performed at [place] [address] [place], [place] [address]   Total CHOL/HDL Ratio 5.3 RATIO    Comment: Performed at [facility], [place] [address], [place], [place] [address]   VLDL 64 (H) 0 - 40 mg/dL    Comment: Performed at [facility], [place] [address] [place], [place] [address]   LDL Cholesterol NOT CALCULATED 0 - 99 mg/dL    Comment: Performed at [facility], [address] [place], [place] [address]  Respiratory Panel by RT PCR (Flu A&B, Covid) - Nasopharyngeal Swab     Status: None   Collection Time: 12/13/19  9:35 PM   Specimen: Nasopharyngeal Swab  Result Value Ref Range   SARS Coronavirus 2 by RT PCR NEGATIVE NEGATIVE    Comment: (NOTE) SARS-CoV-2 target nucleic acids are NOT DETECTED. The SARS-CoV-2 RNA is generally detectable in upper respiratoy specimens during the acute phase of infection. The lowest concentration of SARS-CoV-2 viral copies this assay can detect is 131 copies/mL. A negative result does not preclude SARS-Cov-2 infection and should not be used as the sole basis for treatment or other patient management decisions. A negative result may occur with  improper specimen collection/handling, submission of specimen other than nasopharyngeal swab, presence of viral mutation(s) within the areas targeted by this assay, and inadequate number of viral copies (<131  copies/mL). A negative result must be combined with clinical observations, patient history, and epidemiological information. The expected result is Negative. Fact Sheet for Patients:  PinkCheek.be Fact Sheet for Healthcare Providers:  GravelBags.it This test is not yet ap proved or cleared by the [place] FDA and  has been authorized for detection and/or diagnosis of SARS-CoV-2 by FDA under an Emergency Use Authorization (EUA). This EUA will remain  in effect (meaning this test can be used) for the duration of the COVID-19 declaration under Section 564(b)(1) of the Act, 21 U.S.C. section 360bbb-3(b)(1), unless the authorization is terminated or revoked sooner.    Influenza A by PCR NEGATIVE NEGATIVE   Influenza B by PCR NEGATIVE NEGATIVE    Comment: (NOTE) The Xpert Xpress SARS-CoV-2/FLU/RSV assay is intended as an aid in  the diagnosis of influenza from Nasopharyngeal swab specimens and  should not be used as a sole basis for treatment. Nasal washings and  aspirates are unacceptable for Xpert Xpress SARS-CoV-2/FLU/RSV  testing. Fact Sheet for Patients: PinkCheek.be Fact Sheet for Healthcare Providers: GravelBags.it This test is not yet approved or cleared by the [place] FDA and  has been authorized for detection and/or diagnosis of SARS-CoV-2 by  FDA under an Emergency Use Authorization (EUA). This EUA will remain  in effect (meaning this test can be used) for the duration of the  Covid-19 declaration under Section 564(b)(1) of the Act, 21  U.S.C. section 360bbb-3(b)(1), unless the authorization is  terminated or revoked. Performed at [facility], [address] [place], [place] [address]   POCT Activated clotting time     Status: None   Collection Time: 12/13/19 10:42 PM  Result Value Ref Range   Activated Clotting Time 422 seconds  POCT  Activated clotting time     Status: None   Collection Time: 12/13/19 11:09 PM  Result Value Ref Range   Activated Clotting Time 340 seconds  MRSA PCR Screening     Status: None   Collection Time: 12/14/19  1:03 AM   Specimen: Nasopharyngeal  Result Value Ref Range   MRSA by PCR NEGATIVE NEGATIVE    Comment:        The GeneXpert MRSA Assay (FDA approved for NASAL specimens only), is one component of a comprehensive MRSA colonization surveillance program. It is not intended to diagnose MRSA infection nor to guide or monitor treatment for MRSA infections. Performed at [facility], [place] [address], [place], [place] [address]   Basic metabolic panel     Status: Abnormal   Collection Time: 12/14/19  2:15 AM  Result Value Ref Range   Sodium 136 135 - 145 mmol/L   Potassium 4.3 3.5 - 5.1 mmol/L   Chloride 100 98 - 111 mmol/L   CO2 21 (L) 22 - 32 mmol/L   Glucose, Bld 123 (H) 70 - 99 mg/dL    Comment: Glucose reference range applies only to samples taken after fasting for at least 8 hours.   BUN 31 (H) 8 - 23 mg/dL   Creatinine, Ser 1.81 (H) 0.61 - 1.24 mg/dL   Calcium 9.2 8.9 - 10.3 mg/dL   GFR calc non Af Amer 38 (L) >60 mL/min   GFR calc Af Amer 44 (L) >60 mL/min   Anion gap 15 5 - 15    Comment: Performed at [place] [address], [place], [place] [address]  CBC     Status: Abnormal   Collection Time: 12/14/19  2:15 AM  Result Value Ref Range   WBC 14.3 (H) 4.0 - 10.5 K/uL   RBC 5.14 4.22 - 5.81 MIL/uL   Hemoglobin 17.6 (H) 13.0 - 17.0 g/dL   HCT 50.3 39.0 - 52.0 %   MCV 97.9 80.0 - 100.0 fL   MCH 34.2 (H) 26.0 - 34.0 pg   MCHC 35.0 30.0 - 36.0 g/dL   RDW 12.1 11.5 - 15.5 %   Platelets 314 150 - 400 K/uL   nRBC 0.0 0.0 - 0.2 %    Comment: Performed at [facility], [place] [address], [place], [place] [address]  Troponin I (High Sensitivity)     Status: Abnormal   Collection Time: 12/14/19  2:15 AM  Result Value Ref Range   Troponin I (High  Sensitivity) >27,000 (HH) <18 ng/L    Comment: CRITICAL RESULT CALLED TO, READ BACK BY AND VERIFIED WITH: PITTMAN S,RN 12/14/19 0444 WAYK Performed at [facility], [place] [address], [place], [place] [address]   Troponin I (High Sensitivity)     Status: Abnormal   Collection Time: 12/14/19  9:13 AM  Result Value Ref Range   Troponin I (High Sensitivity) >27,000 (HH) <18 ng/L    Comment: CRITICAL VALUE NOTED.  VALUE IS CONSISTENT WITH PREVIOUSLY REPORTED AND CALLED VALUE. (NOTE) Elevated high sensitivity troponin I (hsTnI) values and significant  changes across serial measurements may suggest ACS but  many other  chronic and acute conditions are known to elevate hsTnI results.  Refer to the Links section for chest pain algorithms and additional  guidance. Performed at [facility], [place] [address], [place], [place] [address]   Heparin level (unfractionated)     Status: Abnormal   Collection Time: 12/14/19 10:34 AM  Result Value Ref Range   Heparin Unfractionated 0.26 (L) 0.30 - 0.70 IU/mL    Comment: (NOTE) If heparin results are below expected values, and patient dosage has  been confirmed, suggest follow up testing of antithrombin III levels. Performed at [facility], [place] [address] [place], [place] [address]   APTT     Status: Abnormal   Collection Time: 12/14/19 10:34 AM  Result Value Ref Range   aPTT 42 (H) 24 - 36 seconds    Comment:        IF BASELINE aPTT IS ELEVATED, SUGGEST PATIENT RISK ASSESSMENT BE USED TO DETERMINE APPROPRIATE ANTICOAGULANT THERAPY. Performed at [facility], [place] [address] [place], [place] [address]   APTT     Status: Abnormal   Collection Time: 12/14/19  6:42 PM  Result Value Ref Range   aPTT 44 (H) 24 - 36 seconds    Comment:        IF BASELINE aPTT IS ELEVATED, SUGGEST PATIENT RISK ASSESSMENT BE USED TO DETERMINE APPROPRIATE ANTICOAGULANT THERAPY. Performed at [facility], [place] [address], [place] [address]   CBC     Status: None   Collection Time: 12/15/19  3:24 AM  Result Value Ref Range   WBC 9.8 4.0 - 10.5 K/uL   RBC 4.84 4.22 - 5.81 MIL/uL   Hemoglobin 16.3 13.0 - 17.0 g/dL   HCT 47.2 39.0 - 52.0 %   MCV 97.5 80.0 - 100.0 fL   MCH 33.7 26.0 - 34.0 pg   MCHC 34.5 30.0 - 36.0 g/dL   RDW 12.2 11.5 - 15.5 %   Platelets 227 150 - 400 K/uL   nRBC 0.0 0.0 - 0.2 %    Comment: Performed at [facility], [place] [address] [place], [place] [address]  APTT     Status: Abnormal   Collection Time: 12/15/19  3:24 AM  Result Value Ref Range   aPTT 58 (H) 24 - 36 seconds    Comment:        IF BASELINE aPTT IS ELEVATED, SUGGEST PATIENT RISK ASSESSMENT BE USED TO DETERMINE APPROPRIATE ANTICOAGULANT THERAPY. Performed at [facility], [place] [address] [place], [place] [address]   Heparin level (unfractionated)     Status: Abnormal   Collection Time: 12/15/19  3:24 AM  Result Value Ref Range   Heparin Unfractionated 0.22 (L) 0.30 - 0.70 IU/mL    Comment: (NOTE) If heparin results are below expected values, and patient dosage has  been confirmed, suggest follow up testing of antithrombin III levels. Performed at [facility], [place] [address] [place], [place] [address]     ECG   Sinus rhythm with inferior and anterolateral ST elevation and T wave inversion- Personally Reviewed  Telemetry   Sinus rhythm - Personally Reviewed  Radiology    CARDIAC CATHETERIZATION  Result Date: 12/14/2019  A stent was successfully placed.  A stent was successfully placed.   Anteroseptal myocardial infarction 12 occlusion of the proximal LAD.  Successful PCI with overlapping stent from ostial LAD to mid vessel using 3.0, 26 and 34 mm  long Onyx stents deployed at 14 atm.  0% with initial TIMI grade II flow that improved to TIMI grade III after intracoronary nitroglycerin.  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  Widely patent left main.  Widely patent  ramus and circumflex.  Acute on chronic combined systolic and diastolic heart failure with LVEDP 34 mmHg.  Estimated ejection fraction 40%.  Intra-aortic balloon pump placed post stenting because of developing pulmonary edema and high left ventricular filling pressures. RECOMMENDATIONS:  Intra-aortic balloon pump at one-to-one.  IV nitroglycerin  IV heparin  Continue beta-blocker therapy but at lower dose  High intensity statin therapy  Diuretic therapy as needed to control pulmonary edema  Supplemental oxygen potentially escalating to BiPAP if needed for oxygenation  Guarded prognosis from the standpoint of heart failure and CKD stage IV.  The patient received 160 cc of contrast during a complicated procedure.   DG CHEST PORT 1 VIEW  Result Date: 12/15/2019 CLINICAL DATA:  Intra-aortic balloon pump.  Follow-up exam. EXAM: PORTABLE CHEST 1 VIEW COMPARISON:  12/14/2019 FINDINGS: Metal tip of the intra-aortic balloon pump projects over the aortic knob, well positioned. Cardiac silhouette is normal in size. No mediastinal or hilar masses. Lungs show prominent bronchovascular markings, but are otherwise clear. No visualized pleural effusion and no pneumothorax. IMPRESSION: 1. Well-positioned intra-aortic balloon pump. 2. No acute cardiopulmonary disease. Electronically Signed   By: Lajean Manes M.D.   On: 12/15/2019 09:00   DG CHEST PORT 1 VIEW  Result Date: 12/14/2019 CLINICAL DATA:  Intra-aortic balloon pump. EXAM: PORTABLE CHEST 1 VIEW COMPARISON:  12/13/2019 FINDINGS: 11:05 a.m. Low volumes. The cardio pericardial silhouette is enlarged. Marker for intra-aortic balloon pump projects over the proximal descending thoracic aorta near the junction with the transverse segment. There is pulmonary vascular congestion without overt pulmonary edema. No substantial pleural effusion. The visualized bony structures of the thorax are intact. Telemetry leads overlie the chest. IMPRESSION: Low volume film with  cardiomegaly and vascular congestion. Intra-aortic balloon pump tip overlies the lower transverse aorta, likely positioned in the proximal descending segment. Electronically Signed   By: Misty Stanley M.D.   On: 12/14/2019 11:14   DG Chest Port 1 View  Result Date: 12/13/2019 CLINICAL DATA:  Chest pain for 1 hour EXAM: PORTABLE CHEST 1 VIEW COMPARISON:  None. FINDINGS: No pleural effusion or focal consolidation. Borderline cardiomegaly. Mild central vascular congestion. Diffuse interstitial opacity. No pneumothorax. IMPRESSION: Borderline cardiomegaly with mild central vascular congestion. Diffuse interstitial opacity, could reflect mild interstitial edema. Electronically Signed   By: Donavan Foil M.D.   On: 12/13/2019 21:39   ECHOCARDIOGRAM COMPLETE  Result Date: 12/14/2019    ECHOCARDIOGRAM REPORT   Patient Name:   MARLAND Montella Date of Exam: 12/14/2019 Medical Rec #:  [id number]      Height:       69.0 in Accession #:    [id number]     Weight:       288.1 lb Date of Birth:  08-28-1951     BSA:          2.413 m Patient Age:    69 years       BP:           116/73 mmHg Patient Gender: M              HR:           63 bpm. Exam Location:  Inpatient Procedure: 2D Echo, Cardiac Doppler, Color Doppler and Intracardiac  Opacification Agent Indications:    CAD  History:        Patient has no prior history of Echocardiogram examinations.                 Acute MI, Signs/Symptoms:Chest Pain; Risk Factors:Hypertension.                 DVT, Pul.emboli.  Sonographer:    Dustin Flock Referring Phys: [place]  1. Left ventricular ejection fraction, by estimation, is 25 to 30%. The left ventricle has severely decreased function. The left ventricle demonstrates regional wall motion abnormalities (see scoring diagram/findings for description). There is moderate asymmetric left ventricular hypertrophy. Left ventricular diastolic parameters are consistent with Grade II diastolic  dysfunction (pseudonormalization). There is severe akinesis of the left ventricular, entire anteroseptal wall.  2. Right ventricular systolic function is normal. The right ventricular size is normal.  3. Left atrial size was mildly dilated.  4. The mitral valve is normal in structure. Trivial mitral valve regurgitation. No evidence of mitral stenosis.  5. The aortic valve is normal in structure. Aortic valve regurgitation is not visualized. No aortic stenosis is present. FINDINGS  Left Ventricle: Left ventricular ejection fraction, by estimation, is 25 to 30%. The left ventricle has severely decreased function. The left ventricle demonstrates regional wall motion abnormalities. Severe akinesis of the left ventricular, entire anteroseptal wall. Definity contrast agent was given IV to delineate the left ventricular endocardial borders. The left ventricular internal cavity size was normal in size. There is moderate asymmetric left ventricular hypertrophy. Left ventricular diastolic parameters are consistent with Grade II diastolic dysfunction (pseudonormalization). Right Ventricle: The right ventricular size is normal. No increase in right ventricular wall thickness. Right ventricular systolic function is normal. Left Atrium: Left atrial size was mildly dilated. Right Atrium: Right atrial size was normal in size. Pericardium: There is no evidence of pericardial effusion. Mitral Valve: The mitral valve is normal in structure. Trivial mitral valve regurgitation. No evidence of mitral valve stenosis. Tricuspid Valve: The tricuspid valve is normal in structure. Tricuspid valve regurgitation is trivial. No evidence of tricuspid stenosis. Aortic Valve: The aortic valve is normal in structure. Aortic valve regurgitation is not visualized. No aortic stenosis is present. Pulmonic Valve: The pulmonic valve was normal in structure. Pulmonic valve regurgitation is not visualized. No evidence of pulmonic stenosis. Aorta: The aortic  root and ascending aorta are structurally normal, with no evidence of dilitation. IAS/Shunts: The atrial septum is grossly normal.  LEFT VENTRICLE PLAX 2D LVIDd:         5.30 cm  Diastology LVIDs:         4.55 cm  LV e' lateral:   6.85 cm/s LV PW:         1.30 cm  LV E/e' lateral: 12.2 LV IVS:        1.50 cm  LV e' medial:    4.46 cm/s LVOT diam:     2.40 cm  LV E/e' medial:  18.8 LV SV:         91 LV SV Index:   38 LVOT Area:     4.52 cm  RIGHT VENTRICLE RV Basal diam:  2.30 cm RV S prime:     13.60 cm/s TAPSE (M-mode): 3.0 cm LEFT ATRIUM             Index       RIGHT ATRIUM           Index LA diam:  3.70 cm 1.53 cm/m  RA Area:     11.00 cm LA Vol (A2C):   91.5 ml 37.92 ml/m RA Volume:   25.00 ml  10.36 ml/m LA Vol (A4C):   69.2 ml 28.68 ml/m LA Biplane Vol: 84.4 ml 34.98 ml/m  AORTIC VALVE LVOT Vmax:   119.00 cm/s LVOT Vmean:  64.500 cm/s LVOT VTI:    0.201 m  AORTA Ao Root diam: 3.40 cm MITRAL VALVE MV Area (PHT): 4.89 cm    SHUNTS MV Decel Time: 155 msec    Systemic VTI:  0.20 m MV E velocity: 83.70 cm/s  Systemic Diam: 2.40 cm MV A velocity: 63.40 cm/s MV E/A ratio:  1.32 Mertie Moores MD Electronically signed by Mertie Moores MD Signature Date/Time: 12/14/2019/1:57:50 PM    Final     Cardiac Studies   Procedure: 2D Echo, Cardiac Doppler, Color Doppler and Intracardiac       Opacification Agent   Indications:  CAD    History:    Patient has no prior history of Echocardiogram  examinations.         Acute MI, Signs/Symptoms:Chest Pain; Risk  Factors:Hypertension.         DVT, Pul.emboli.    Sonographer:  Dustin Flock  Referring Phys: [place]    1. Left ventricular ejection fraction, by estimation, is 25 to 30%. The  left ventricle has severely decreased function. The left ventricle  demonstrates regional wall motion abnormalities (see scoring  diagram/findings for description). There is moderate  asymmetric  left ventricular hypertrophy. Left ventricular diastolic  parameters are consistent with Grade II diastolic dysfunction  (pseudonormalization). There is severe akinesis of the left ventricular,  entire anteroseptal wall.  2. Right ventricular systolic function is normal. The right ventricular  size is normal.  3. Left atrial size was mildly dilated.  4. The mitral valve is normal in structure. Trivial mitral valve  regurgitation. No evidence of mitral stenosis.  5. The aortic valve is normal in structure. Aortic valve regurgitation is  not visualized. No aortic stenosis is present  Assessment   1. Active Problems: 2.   CKD (chronic kidney disease) stage 4, GFR 15-29 ml/min (HCC) 3.   Acute ST elevation myocardial infarction (STEMI) due to occlusion of left anterior descending (LAD) coronary artery ([place]) 4.   Acute combined systolic and diastolic heart failure ([place]) 5.   Pulmonary embolism during current hospitalization ([place]) 6.   Acute ST elevation myocardial infarction (STEMI) due to occlusion of distal portion of left anterior descending (LAD) coronary artery ([place]) 7.   ST elevation myocardial infarction (STEMI) ([place]) 8.   On intra-aortic balloon pump assist 9.   Plan   1. Acute anterior STEMI: Ischemic cardiomyopathy with LVEF 25-30%, s/p IABP placement, now 1:2 augmentation. No further chest pain. On DAPT with ASA/Plavix and heparin for IABP and history of DVT/PE. Low dose metoprolol ordered - may need to hold if there is low output failure. Would recommend central access, check CO-OX/CVP (no central line, only IABP sheath).  2. Acute systolic congestive heart failure/possible cardiogenic shock: LVEDP at cath was 34. CVP appears elevated, however, has had brisk diuresis. IABP weaned to 1:2 yesterday, may be able to switch to 1:3 today. 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 D/w Dr. Haroldine Laws with Advanced HF/Shock team, he will evaluate today. 3. CKD IV: Creatinine 1.9, now 1.81 - ?if chronic, no  old comparator that I could find. monitor, avoid nephrotoxins 4. 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 Continue DAPT + AC for 1 month, then likely d/c aspirin. 5. Hyperlipidemia: LDL not calculated - will resend lipid profilein AM - trigs 320. On lipitor 80 mg.  CRITICAL CARE TIME: I have spent a total of 35 minutes with patient reviewing hospital notes, telemetry, EKGs, labs and examining the patient as well as establishing an assessment and plan that was discussed with the patient. > 50% of time was spent in direct patient care. The patient is critically ill with multi-organ system failure and requires high complexity decision making for assessment and support, frequent evaluation and titration of therapies, application of advanced monitoring technologies and extensive interpretation of multiple databases.  Length of Stay:  LOS: 1 day   Pixie Casino, MD, [facility], [place] Director of the Advanced Lipid Disorders &  Cardiovascular Risk Reduction Clinic Diplomate of the American Board of Clinical Lipidology Attending Cardiologist  Direct Dial: [phone]  Fax: [phone]  Website:  [email or web address] 12/15/2019, 9:21 AM

## 2019-12-15 NOTE — Progress Notes (Signed)
To 2H26 for IABP removal; RFA site noted with staged echymosis; level 0 prior to sheath removal. Pt. Advised of sheath removal process; ACT within acceptable range for removal; IABP d/c'd; dressing removed, r dp +; sheath removed with tip intact; manual pressure held x 45 min. HR 70's; bp/ 113/70's. Pt. Remained stable throughout manual pressure. Successful hold with hemostasis achieved.  Pt. Advised to hold pressure to rfa if (hand guided to site),he laughs, coughs, sneezes, or bears down. Pt. Verbally acknowledges understanding as he repeats back to me. Pt. Left in the care of Jessica, RN in stable condition.

## 2019-12-16 ENCOUNTER — Inpatient Hospital Stay (HOSPITAL_COMMUNITY): Payer: Medicare Other

## 2019-12-16 LAB — CBC
HCT: 46.3 % (ref 39.0–52.0)
Hemoglobin: 16.2 g/dL (ref 13.0–17.0)
MCH: 34.3 pg — ABNORMAL HIGH (ref 26.0–34.0)
MCHC: 35 g/dL (ref 30.0–36.0)
MCV: 98.1 fL (ref 80.0–100.0)
Platelets: 198 10*3/uL (ref 150–400)
RBC: 4.72 MIL/uL (ref 4.22–5.81)
RDW: 12.1 % (ref 11.5–15.5)
WBC: 10.6 10*3/uL — ABNORMAL HIGH (ref 4.0–10.5)
nRBC: 0 % (ref 0.0–0.2)

## 2019-12-16 LAB — BASIC METABOLIC PANEL
Anion gap: 10 (ref 5–15)
BUN: 20 mg/dL (ref 8–23)
CO2: 28 mmol/L (ref 22–32)
Calcium: 8.8 mg/dL — ABNORMAL LOW (ref 8.9–10.3)
Chloride: 97 mmol/L — ABNORMAL LOW (ref 98–111)
Creatinine, Ser: 1.45 mg/dL — ABNORMAL HIGH (ref 0.61–1.24)
GFR calc Af Amer: 57 mL/min — ABNORMAL LOW (ref >60)
GFR calc non Af Amer: 49 mL/min — ABNORMAL LOW (ref >60)
Glucose, Bld: 104 mg/dL — ABNORMAL HIGH (ref 70–99)
Potassium: 3.9 mmol/L (ref 3.5–5.1)
Sodium: 135 mmol/L (ref 135–145)

## 2019-12-16 LAB — LIPID PANEL
Cholesterol: 167 mg/dL (ref 0–200)
HDL: 42 mg/dL (ref >40)
LDL Cholesterol: 106 mg/dL — ABNORMAL HIGH (ref 0–99)
Total CHOL/HDL Ratio: 4 RATIO
Triglycerides: 96 mg/dL (ref <150)
VLDL: 19 mg/dL (ref 0–40)

## 2019-12-16 MED ORDER — CARVEDILOL 3.125 MG PO TABS
3.1250 mg | ORAL_TABLET | Freq: Two times a day (BID) | ORAL | Status: DC
  Administered 2019-12-16 – 2019-12-17 (×3): 3.125 mg via ORAL
  Filled 2019-12-16 (×3): qty 1

## 2019-12-16 MED ORDER — SPIRONOLACTONE 12.5 MG HALF TABLET
12.5000 mg | ORAL_TABLET | Freq: Every day | ORAL | Status: DC
  Administered 2019-12-16 – 2019-12-17 (×2): 12.5 mg via ORAL
  Filled 2019-12-16 (×2): qty 1

## 2019-12-16 MED ORDER — LOSARTAN POTASSIUM 25 MG PO TABS
25.0000 mg | ORAL_TABLET | Freq: Every day | ORAL | Status: DC
  Administered 2019-12-16 – 2019-12-17 (×2): 25 mg via ORAL
  Filled 2019-12-16 (×2): qty 1

## 2019-12-16 NOTE — Plan of Care (Signed)
  Problem: Activity: Goal: Ability to return to baseline activity level will improve Outcome: Progressing   Problem: Cardiovascular: Goal: Ability to achieve and maintain adequate cardiovascular perfusion will improve Outcome: Progressing Goal: Vascular access site(s) Level 0-1 will be maintained Outcome: Progressing   Problem: Clinical Measurements: Goal: Ability to maintain clinical measurements within normal limits will improve Outcome: Progressing Goal: Diagnostic test results will improve Outcome: Progressing Goal: Cardiovascular complication will be avoided Outcome: Progressing

## 2019-12-16 NOTE — Progress Notes (Signed)
Patient arrived from [facility] to 4E12, patient placed on monitor and vital signs obtained. Patient up to chair. Call bell within reach. Will monitor patient. Jaeveon [place], Bettina Gavia RN

## 2019-12-16 NOTE — Progress Notes (Signed)
DAILY PROGRESS NOTE   Patient Name: Thomas Carpenter Date of Encounter: 12/16/2019 Cardiologist: No primary care provider on file.  Chief Complaint   No chest pain  Patient Profile   Thomas Carpenter is a 69 y.o. 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  Subjective   Appreciate Dr. Clayborne Dana recommendations - IABP pulled yesterday. Additional diuresis ordered - He was net negative 1.7L yesterday. BP remains soft around 885 systolic. Digoxin and aldactone added. Plan to resume low dose apixaban 2.5 mg BID (for DVT/PE) and continue ASA+PLAVIX for 1 month, then drop aspirin. Creatinine increased today at 1.45.  Objective   Vitals:   12/16/19 0500 12/16/19 0600 12/16/19 0700 12/16/19 0800  BP: (!) 98/45 (!) 117/93 108/67 (!) 96/50  Pulse: 80 72 (!) 47 67  Resp: 19 (!) 24 (!) 21 (!) 23  Temp:      TempSrc:      SpO2: 96% 96% 96% 97%  Weight:  122.2 kg    Height:        Intake/Output Summary (Last 24 hours) at 12/16/2019 0951 Last data filed at 12/16/2019 0600 Gross per 24 hour  Intake 878.42 ml  Output 2660 ml  Net -1781.58 ml   Filed Weights   12/13/19 2117 12/15/19 0330 12/16/19 0600  Weight: 130.7 kg 126 kg 122.2 kg    Physical Exam   General appearance: alert and no distress Neck: no carotid bruit, no JVD and thyroid not enlarged, symmetric, no tenderness/mass/nodules Lungs: clear to auscultation bilaterally Heart: regular rate and rhythm, S1, S2 normal, no murmur, click, rub or gallop Abdomen: soft, non-tender; bowel sounds normal; no masses,  no organomegaly Extremities: extremities normal, atraumatic, no cyanosis or edema Pulses: 2+ and symmetric Skin: Skin color, texture, turgor normal. No rashes or lesions Neurologic: Grossly normal Psych: Pleasant, poor insight  Inpatient Medications    Scheduled Meds: . apixaban  2.5 mg Oral BID  . aspirin  81 mg Oral Daily  . atorvastatin  80 mg Oral q1800  .  Chlorhexidine Gluconate Cloth  6 each Topical Daily  . clopidogrel  75 mg Oral Q breakfast  . digoxin  0.125 mg Oral Daily  . mouth rinse  15 mL Mouth Rinse BID  . metoprolol tartrate  12.5 mg Oral BID  . sodium chloride flush  3 mL Intravenous Q12H    Continuous Infusions: . sodium chloride    . nitroGLYCERIN Stopped (12/15/19 0235)    PRN Meds: sodium chloride, acetaminophen, nitroGLYCERIN, ondansetron (ZOFRAN) IV, oxyCODONE, sodium chloride flush   Labs   Results for orders placed or performed during the hospital encounter of 12/13/19 (from the past 48 hour(s))  Troponin I (High Sensitivity)     Status: Abnormal   Collection Time: 12/14/19  9:13 AM  Result Value Ref Range   Troponin I (High Sensitivity) >27,000 (HH) <18 ng/L    Comment: CRITICAL VALUE NOTED.  VALUE IS CONSISTENT WITH PREVIOUSLY REPORTED AND CALLED VALUE. (NOTE) Elevated high sensitivity troponin I (hsTnI) values and significant  changes across serial measurements may suggest ACS but many other  chronic and acute conditions are known to elevate hsTnI results.  Refer to the Links section for chest pain algorithms and additional  guidance. Performed at [facility], [place] [address] [place], [place] [address]   Heparin level (unfractionated)     Status: Abnormal   Collection Time: 12/14/19 10:34 AM  Result Value Ref Range   Heparin  Unfractionated 0.26 (L) 0.30 - 0.70 IU/mL    Comment: (NOTE) If heparin results are below expected values, and patient dosage has  been confirmed, suggest follow up testing of antithrombin III levels. Performed at [facility], [place] [address], [place], [place] [address]   APTT     Status: Abnormal   Collection Time: 12/14/19 10:34 AM  Result Value Ref Range   aPTT 42 (H) 24 - 36 seconds    Comment:        IF BASELINE aPTT IS ELEVATED, SUGGEST PATIENT RISK ASSESSMENT BE USED TO DETERMINE APPROPRIATE ANTICOAGULANT THERAPY. Performed at [facility],  [place] [address], [place], [place] [address]   APTT     Status: Abnormal   Collection Time: 12/14/19  6:42 PM  Result Value Ref Range   aPTT 44 (H) 24 - 36 seconds    Comment:        IF BASELINE aPTT IS ELEVATED, SUGGEST PATIENT RISK ASSESSMENT BE USED TO DETERMINE APPROPRIATE ANTICOAGULANT THERAPY. Performed at [facility], [place] [address] [place] [address]   CBC     Status: None   Collection Time: 12/15/19  3:24 AM  Result Value Ref Range   WBC 9.8 4.0 - 10.5 K/uL   RBC 4.84 4.22 - 5.81 MIL/uL   Hemoglobin 16.3 13.0 - 17.0 g/dL   HCT 47.2 39.0 - 52.0 %   MCV 97.5 80.0 - 100.0 fL   MCH 33.7 26.0 - 34.0 pg   MCHC 34.5 30.0 - 36.0 g/dL   RDW 12.2 11.5 - 15.5 %   Platelets 227 150 - 400 K/uL   nRBC 0.0 0.0 - 0.2 %    Comment: Performed at [facility], [place] [address], [place], [place] [address]  APTT     Status: Abnormal   Collection Time: 12/15/19  3:24 AM  Result Value Ref Range   aPTT 58 (H) 24 - 36 seconds    Comment:        IF BASELINE aPTT IS ELEVATED, SUGGEST PATIENT RISK ASSESSMENT BE USED TO DETERMINE APPROPRIATE ANTICOAGULANT THERAPY. Performed at [facility], [place] [address] [place], [place] [address]   Heparin level (unfractionated)     Status: Abnormal   Collection Time: 12/15/19  3:24 AM  Result Value Ref Range   Heparin Unfractionated 0.22 (L) 0.30 - 0.70 IU/mL    Comment: (NOTE) If heparin results are below expected values, and patient dosage has  been confirmed, suggest follow up testing of antithrombin III levels. Performed at [facility], [place] [address] [place], [place] [address]   Basic metabolic panel     Status: Abnormal   Collection Time: 12/15/19 10:56 AM  Result Value Ref Range   Sodium 134 (L) 135 - 145 mmol/L   Potassium 4.6 3.5 - 5.1 mmol/L   Chloride 99 98 - 111 mmol/L   CO2 24 22 - 32 mmol/L   Glucose, Bld 99 70 - 99 mg/dL    Comment: Glucose reference range applies only to samples taken after fasting  for at least 8 hours.   BUN 18 8 - 23 mg/dL   Creatinine, Ser 1.30 (H) 0.61 - 1.24 mg/dL   Calcium 8.5 (L) 8.9 - 10.3 mg/dL   GFR calc non Af Amer 56 (L) >60 mL/min   GFR calc Af Amer >60 >60 mL/min   Anion gap 11 5 - 15    Comment: Performed at [facility], [place] [address], [place], [place] [address]  APTT     Status: Abnormal   Collection Time: 12/15/19 10:56 AM  Result Value Ref Range   aPTT 58 (H) 24 - 36 seconds    Comment:        IF BASELINE aPTT IS ELEVATED, SUGGEST PATIENT RISK ASSESSMENT BE USED TO DETERMINE APPROPRIATE ANTICOAGULANT THERAPY. Performed at [facility], [place] [address] [place], [place] [address]   Heparin level (unfractionated)     Status: Abnormal   Collection Time: 12/15/19 10:56 AM  Result Value Ref Range   Heparin Unfractionated 0.26 (L) 0.30 - 0.70 IU/mL    Comment: (NOTE) If heparin results are below expected values, and patient dosage has  been confirmed, suggest follow up testing of antithrombin III levels. Performed at [facility], [place] [address], [place], [place] [address]   POCT Activated clotting time     Status: None   Collection Time: 12/15/19 11:00 AM  Result Value Ref Range   Activated Clotting Time 136 seconds  CBC     Status: Abnormal   Collection Time: 12/16/19  1:47 AM  Result Value Ref Range   WBC 10.6 (H) 4.0 - 10.5 K/uL   RBC 4.72 4.22 - 5.81 MIL/uL   Hemoglobin 16.2 13.0 - 17.0 g/dL   HCT 46.3 39.0 - 52.0 %   MCV 98.1 80.0 - 100.0 fL   MCH 34.3 (H) 26.0 - 34.0 pg   MCHC 35.0 30.0 - 36.0 g/dL   RDW 12.1 11.5 - 15.5 %   Platelets 198 150 - 400 K/uL   nRBC 0.0 0.0 - 0.2 %    Comment: Performed at [facility], [place] [address], [place], [place] [address]  Basic metabolic panel     Status: Abnormal   Collection Time: 12/16/19  1:47 AM  Result Value Ref Range   Sodium 135 135 - 145 mmol/L   Potassium 3.9 3.5 - 5.1 mmol/L   Chloride 97 (L) 98 - 111 mmol/L   CO2 28 22 - 32 mmol/L   Glucose, Bld 104  (H) 70 - 99 mg/dL    Comment: Glucose reference range applies only to samples taken after fasting for at least 8 hours.   BUN 20 8 - 23 mg/dL   Creatinine, Ser 1.45 (H) 0.61 - 1.24 mg/dL   Calcium 8.8 (L) 8.9 - 10.3 mg/dL   GFR calc non Af Amer 49 (L) >60 mL/min   GFR calc Af Amer 57 (L) >60 mL/min   Anion gap 10 5 - 15    Comment: Performed at [place] [address], [place], [place] [address]    ECG   N/A  Telemetry   Sinus rhythm\tachycardia - Personally Reviewed  Radiology    DG CHEST PORT 1 VIEW  Result Date: 12/16/2019 CLINICAL DATA:  Heart failure.  Follow-up exam. EXAM: PORTABLE CHEST 1 VIEW COMPARISON:  12/15/2019 FINDINGS: Cardiac silhouette is normal in size. No mediastinal or hilar masses. Intra-aortic balloon pump has been removed. Lungs are clear.  No convincing pleural effusion.  No pneumothorax. IMPRESSION: 1. No acute cardiopulmonary disease. Electronically Signed   By: Lajean Manes M.D.   On: 12/16/2019 08:14   DG CHEST PORT 1 VIEW  Result Date: 12/15/2019 CLINICAL DATA:  Intra-aortic balloon pump.  Follow-up exam. EXAM: PORTABLE CHEST 1 VIEW COMPARISON:  12/14/2019 FINDINGS: Metal tip of the intra-aortic balloon pump projects over the aortic knob, well positioned. Cardiac  silhouette is normal in size. No mediastinal or hilar masses. Lungs show prominent bronchovascular markings, but are otherwise clear. No visualized pleural effusion and no pneumothorax. IMPRESSION: 1. Well-positioned intra-aortic balloon pump. 2. No acute cardiopulmonary disease. Electronically Signed   By: Lajean Manes M.D.   On: 12/15/2019 09:00   DG CHEST PORT 1 VIEW  Result Date: 12/14/2019 CLINICAL DATA:  Intra-aortic balloon pump. EXAM: PORTABLE CHEST 1 VIEW COMPARISON:  12/13/2019 FINDINGS: 11:05 a.m. Low volumes. The cardio pericardial silhouette is enlarged. Marker for intra-aortic balloon pump projects over the proximal descending thoracic aorta near the junction with the  transverse segment. There is pulmonary vascular congestion without overt pulmonary edema. No substantial pleural effusion. The visualized bony structures of the thorax are intact. Telemetry leads overlie the chest. IMPRESSION: Low volume film with cardiomegaly and vascular congestion. Intra-aortic balloon pump tip overlies the lower transverse aorta, likely positioned in the proximal descending segment. Electronically Signed   By: Misty Stanley M.D.   On: 12/14/2019 11:14   ECHOCARDIOGRAM COMPLETE  Result Date: 12/14/2019    ECHOCARDIOGRAM REPORT   Patient Name:   Thomas Carpenter Date of Exam: 12/14/2019 Medical Rec #:  [id number]      Height:       69.0 in Accession #:    [id number]     Weight:       288.1 lb Date of Birth:  1950/11/27     BSA:          2.413 m Patient Age:    34 years       BP:           116/73 mmHg Patient Gender: M              HR:           63 bpm. Exam Location:  Inpatient Procedure: 2D Echo, Cardiac Doppler, Color Doppler and Intracardiac            Opacification Agent Indications:    CAD  History:        Patient has no prior history of Echocardiogram examinations.                 Acute MI, Signs/Symptoms:Chest Pain; Risk Factors:Hypertension.                 DVT, Pul.emboli.  Sonographer:    Dustin Flock Referring Phys: [place]  1. Left ventricular ejection fraction, by estimation, is 25 to 30%. The left ventricle has severely decreased function. The left ventricle demonstrates regional wall motion abnormalities (see scoring diagram/findings for description). There is moderate asymmetric left ventricular hypertrophy. Left ventricular diastolic parameters are consistent with Grade II diastolic dysfunction (pseudonormalization). There is severe akinesis of the left ventricular, entire anteroseptal wall.  2. Right ventricular systolic function is normal. The right ventricular size is normal.  3. Left atrial size was mildly dilated.  4. The mitral valve is normal  in structure. Trivial mitral valve regurgitation. No evidence of mitral stenosis.  5. The aortic valve is normal in structure. Aortic valve regurgitation is not visualized. No aortic stenosis is present. FINDINGS  Left Ventricle: Left ventricular ejection fraction, by estimation, is 25 to 30%. The left ventricle has severely decreased function. The left ventricle demonstrates regional wall motion abnormalities. Severe akinesis of the left ventricular, entire anteroseptal wall. Definity contrast agent was given IV to delineate the left ventricular endocardial borders. The left ventricular internal cavity size was normal in size. There is  moderate asymmetric left ventricular hypertrophy. Left ventricular diastolic parameters are consistent with Grade II diastolic dysfunction (pseudonormalization). Right Ventricle: The right ventricular size is normal. No increase in right ventricular wall thickness. Right ventricular systolic function is normal. Left Atrium: Left atrial size was mildly dilated. Right Atrium: Right atrial size was normal in size. Pericardium: There is no evidence of pericardial effusion. Mitral Valve: The mitral valve is normal in structure. Trivial mitral valve regurgitation. No evidence of mitral valve stenosis. Tricuspid Valve: The tricuspid valve is normal in structure. Tricuspid valve regurgitation is trivial. No evidence of tricuspid stenosis. Aortic Valve: The aortic valve is normal in structure. Aortic valve regurgitation is not visualized. No aortic stenosis is present. Pulmonic Valve: The pulmonic valve was normal in structure. Pulmonic valve regurgitation is not visualized. No evidence of pulmonic stenosis. Aorta: The aortic root and ascending aorta are structurally normal, with no evidence of dilitation. IAS/Shunts: The atrial septum is grossly normal.  LEFT VENTRICLE PLAX 2D LVIDd:         5.30 cm  Diastology LVIDs:         4.55 cm  LV e' lateral:   6.85 cm/s LV PW:         1.30 cm  LV  E/e' lateral: 12.2 LV IVS:        1.50 cm  LV e' medial:    4.46 cm/s LVOT diam:     2.40 cm  LV E/e' medial:  18.8 LV SV:         91 LV SV Index:   38 LVOT Area:     4.52 cm  RIGHT VENTRICLE RV Basal diam:  2.30 cm RV S prime:     13.60 cm/s TAPSE (M-mode): 3.0 cm LEFT ATRIUM             Index       RIGHT ATRIUM           Index LA diam:        3.70 cm 1.53 cm/m  RA Area:     11.00 cm LA Vol (A2C):   91.5 ml 37.92 ml/m RA Volume:   25.00 ml  10.36 ml/m LA Vol (A4C):   69.2 ml 28.68 ml/m LA Biplane Vol: 84.4 ml 34.98 ml/m  AORTIC VALVE LVOT Vmax:   119.00 cm/s LVOT Vmean:  64.500 cm/s LVOT VTI:    0.201 m  AORTA Ao Root diam: 3.40 cm MITRAL VALVE MV Area (PHT): 4.89 cm    SHUNTS MV Decel Time: 155 msec    Systemic VTI:  0.20 m MV E velocity: 83.70 cm/s  Systemic Diam: 2.40 cm MV A velocity: 63.40 cm/s MV E/A ratio:  1.32 Mertie Moores MD Electronically signed by Mertie Moores MD Signature Date/Time: 12/14/2019/1:57:50 PM    Final     Cardiac Studies   Procedure: 2D Echo, Cardiac Doppler, Color Doppler and Intracardiac       Opacification Agent   Indications:  CAD    History:    Patient has no prior history of Echocardiogram  examinations.         Acute MI, Signs/Symptoms:Chest Pain; Risk  Factors:Hypertension.         DVT, Pul.emboli.    Sonographer:  Dustin Flock  Referring Phys: [place]    1. Left ventricular ejection fraction, by estimation, is 25 to 30%. The  left ventricle has severely decreased function. The left ventricle  demonstrates regional wall motion abnormalities (see scoring  diagram/findings for description). There is moderate  asymmetric left ventricular hypertrophy. Left ventricular diastolic  parameters are consistent with Grade II diastolic dysfunction  (pseudonormalization). There is severe akinesis of the left ventricular,  entire anteroseptal wall.  2. Right ventricular systolic function is  normal. The right ventricular  size is normal.  3. Left atrial size was mildly dilated.  4. The mitral valve is normal in structure. Trivial mitral valve  regurgitation. No evidence of mitral stenosis.  5. The aortic valve is normal in structure. Aortic valve regurgitation is  not visualized. No aortic stenosis is present  Assessment   Active Problems:   CKD (chronic kidney disease) stage 4, GFR 15-29 ml/min (HCC)   Acute ST elevation myocardial infarction (STEMI) due to occlusion of left anterior descending (LAD) coronary artery (HCC)   Acute combined systolic and diastolic heart failure (HCC)   Pulmonary embolism during current hospitalization ([place])   Acute ST elevation myocardial infarction (STEMI) due to occlusion of distal portion of left anterior descending (LAD) coronary artery (H[facility])   ST elevation myocardial infarction (STEMI) ([place])   On intra-aortic balloon pump assist   Plan   1. Acute anterior STEMI: Ischemic cardiomyopathy with LVEF 25-30%, s/p IABP placement - balloon pump out yesterday, given additional diuresis. BP improved today. 2. Acute systolic congestive heart failure/possible cardiogenic shock: Given additional diuresis yesterday with close to 2L negative - creatinine mildly increased, but has been lower than admit. BP stable to improved. Tolerating addition of digoxin and aldactone. Appreciate Dr. Clayborne Dana recommendations. ?if low BP and renal function will allow ACE-I/ARB/Entresto. 3. CKD IV: Creatinine 1.9, 1.81, 1.3, 1.45 - ?if chronic, no old comparator that I could find. monitor, avoid nephrotoxins 4. History of DVT/PE: Resumed eliquis 2.5 mg BID. Continue DAPT + AC for 1 month, then likely d/c aspirin. 5. Hyperlipidemia: LDL not calculated - add on to lipid profile in AM - trigs 320. On lipitor 80 mg.  D/w Dr. Haroldine Laws - can probably transfer to the floor today.  Length of Stay:  LOS: 2 days   Pixie Casino, MD, [facility], [place] Director of the Advanced Lipid Disorders &  Cardiovascular Risk Reduction Clinic Diplomate of the American Board of Clinical Lipidology Attending Cardiologist  Direct Dial: [phone]  Fax: [phone]  Website:  [email or web address] [email or web address] Carpenter 12/16/2019, 9:51 AM

## 2019-12-16 NOTE — Progress Notes (Signed)
Patient ambulated in hallway independently. Will monitor patient. Yan Pankratz, Bettina Gavia RN

## 2019-12-16 NOTE — Progress Notes (Addendum)
Advanced Heart Failure Rounding Note   Subjective:    IAPB pulled yesterday.   Good diuresis overnight. Weight down 8 pounds overnight.   Denies CP or SOB. Tachy up to 120 with ambulating room.    Objective:   Weight Range:  Vital Signs:   Temp:  [98.1 F (36.7 C)-98.8 F (37.1 C)] 98.7 F (37.1 C) (03/14 0400) Pulse Rate:  [47-99] 67 (03/14 0800) Resp:  [11-60] 23 (03/14 0800) BP: (86-124)/(45-93) 96/50 (03/14 0800) SpO2:  [90 %-100 %] 97 % (03/14 0800) Weight:  [122.2 kg] 122.2 kg (03/14 0600) Last BM Date: 12/15/19  Weight change: Filed Weights   12/13/19 2117 12/15/19 0330 12/16/19 0600  Weight: 130.7 kg 126 kg 122.2 kg    Intake/Output:   Intake/Output Summary (Last 24 hours) at 12/16/2019 1004 Last data filed at 12/16/2019 0600 Gross per 24 hour  Intake 865.44 ml  Output 2235 ml  Net -1369.56 ml     Physical Exam: General:  Well appearing. No resp difficulty HEENT: normal Neck: supple. JVP . Carotids 2+ bilat; no bruits. No lymphadenopathy or thryomegaly appreciated. Cor: PMI nondisplaced. Regular. Tachy No rubs, gallops or murmurs. Lungs: clear Abdomen: obese soft, nontender, nondistended. No hepatosplenomegaly. No bruits or masses. Good bowel sounds. Extremities: no cyanosis, clubbing, rash, edema. R groin site ok Neuro: alert & orientedx3, cranial nerves grossly intact. moves all 4 extremities w/o difficulty. Affect pleasant  Telemetry: Sinus 100-120. Personally reviewed  Labs: Basic Metabolic Panel: Recent Labs  Lab 12/13/19 2125 12/13/19 2125 12/14/19 0215 12/15/19 1056 12/16/19 0147  NA 136  --  136 134* 135  K 3.6  --  4.3 4.6 3.9  CL 102  --  100 99 97*  CO2 22  --  21* 24 28  GLUCOSE 132*  --  123* 99 104*  BUN 29*  --  31* 18 20  CREATININE 1.90*  --  1.81* 1.30* 1.45*  CALCIUM 9.1   < > 9.2 8.5* 8.8*   < > = values in this interval not displayed.    Liver Function Tests: Recent Labs  Lab 12/13/19 2125  AST 31  ALT 27   ALKPHOS 70  BILITOT 0.6  PROT 7.3  ALBUMIN 3.7   No results for input(s): LIPASE, AMYLASE in the last 168 hours. No results for input(s): AMMONIA in the last 168 hours.  CBC: Recent Labs  Lab 12/13/19 2125 12/14/19 0215 12/15/19 0324 12/16/19 0147  WBC 12.7* 14.3* 9.8 10.6*  NEUTROABS 5.9  --   --   --   HGB 17.1* 17.6* 16.3 16.2  HCT 49.4 50.3 47.2 46.3  MCV 100.6* 97.9 97.5 98.1  PLT 287 314 227 198    Cardiac Enzymes: No results for input(s): CKTOTAL, CKMB, CKMBINDEX, TROPONINI in the last 168 hours.  BNP: BNP (last 3 results) No results for input(s): BNP in the last 8760 hours.  ProBNP (last 3 results) No results for input(s): PROBNP in the last 8760 hours.    Other results:  Imaging: DG CHEST PORT 1 VIEW  Result Date: 12/16/2019 CLINICAL DATA:  Heart failure.  Follow-up exam. EXAM: PORTABLE CHEST 1 VIEW COMPARISON:  12/15/2019 FINDINGS: Cardiac silhouette is normal in size. No mediastinal or hilar masses. Intra-aortic balloon pump has been removed. Lungs are clear.  No convincing pleural effusion.  No pneumothorax. IMPRESSION: 1. No acute cardiopulmonary disease. Electronically Signed   By: Lajean Manes M.D.   On: 12/16/2019 08:14   DG CHEST PORT 1 VIEW  Result Date: 12/15/2019 CLINICAL DATA:  Intra-aortic balloon pump.  Follow-up exam. EXAM: PORTABLE CHEST 1 VIEW COMPARISON:  12/14/2019 FINDINGS: Metal tip of the intra-aortic balloon pump projects over the aortic knob, well positioned. Cardiac silhouette is normal in size. No mediastinal or hilar masses. Lungs show prominent bronchovascular markings, but are otherwise clear. No visualized pleural effusion and no pneumothorax. IMPRESSION: 1. Well-positioned intra-aortic balloon pump. 2. No acute cardiopulmonary disease. Electronically Signed   By: Lajean Manes M.D.   On: 12/15/2019 09:00   DG CHEST PORT 1 VIEW  Result Date: 12/14/2019 CLINICAL DATA:  Intra-aortic balloon pump. EXAM: PORTABLE CHEST 1 VIEW  COMPARISON:  12/13/2019 FINDINGS: 11:05 a.m. Low volumes. The cardio pericardial silhouette is enlarged. Marker for intra-aortic balloon pump projects over the proximal descending thoracic aorta near the junction with the transverse segment. There is pulmonary vascular congestion without overt pulmonary edema. No substantial pleural effusion. The visualized bony structures of the thorax are intact. Telemetry leads overlie the chest. IMPRESSION: Low volume film with cardiomegaly and vascular congestion. Intra-aortic balloon pump tip overlies the lower transverse aorta, likely positioned in the proximal descending segment. Electronically Signed   By: Misty Stanley M.D.   On: 12/14/2019 11:14   ECHOCARDIOGRAM COMPLETE  Result Date: 12/14/2019    ECHOCARDIOGRAM REPORT   Patient Name:   Thomas Carpenter Date of Exam: 12/14/2019 Medical Rec #:  [id number]      Height:       69.0 in Accession #:    [id number]     Weight:       288.1 lb Date of Birth:  March 20, 1951     BSA:          2.413 m Patient Age:    69 years       BP:           116/73 mmHg Patient Gender: M              HR:           63 bpm. Exam Location:  Inpatient Procedure: 2D Echo, Cardiac Doppler, Color Doppler and Intracardiac            Opacification Agent Indications:    CAD  History:        Patient has no prior history of Echocardiogram examinations.                 Acute MI, Signs/Symptoms:Chest Pain; Risk Factors:Hypertension.                 DVT, Pul.emboli.  Sonographer:    Dustin Flock Referring Phys: [place]  1. Left ventricular ejection fraction, by estimation, is 25 to 30%. The left ventricle has severely decreased function. The left ventricle demonstrates regional wall motion abnormalities (see scoring diagram/findings for description). There is moderate asymmetric left ventricular hypertrophy. Left ventricular diastolic parameters are consistent with Grade II diastolic dysfunction (pseudonormalization). There is severe  akinesis of the left ventricular, entire anteroseptal wall.  2. Right ventricular systolic function is normal. The right ventricular size is normal.  3. Left atrial size was mildly dilated.  4. The mitral valve is normal in structure. Trivial mitral valve regurgitation. No evidence of mitral stenosis.  5. The aortic valve is normal in structure. Aortic valve regurgitation is not visualized. No aortic stenosis is present. FINDINGS  Left Ventricle: Left ventricular ejection fraction, by estimation, is 25 to 30%. The left ventricle has severely decreased function. The left ventricle demonstrates regional wall  motion abnormalities. Severe akinesis of the left ventricular, entire anteroseptal wall. Definity contrast agent was given IV to delineate the left ventricular endocardial borders. The left ventricular internal cavity size was normal in size. There is moderate asymmetric left ventricular hypertrophy. Left ventricular diastolic parameters are consistent with Grade II diastolic dysfunction (pseudonormalization). Right Ventricle: The right ventricular size is normal. No increase in right ventricular wall thickness. Right ventricular systolic function is normal. Left Atrium: Left atrial size was mildly dilated. Right Atrium: Right atrial size was normal in size. Pericardium: There is no evidence of pericardial effusion. Mitral Valve: The mitral valve is normal in structure. Trivial mitral valve regurgitation. No evidence of mitral valve stenosis. Tricuspid Valve: The tricuspid valve is normal in structure. Tricuspid valve regurgitation is trivial. No evidence of tricuspid stenosis. Aortic Valve: The aortic valve is normal in structure. Aortic valve regurgitation is not visualized. No aortic stenosis is present. Pulmonic Valve: The pulmonic valve was normal in structure. Pulmonic valve regurgitation is not visualized. No evidence of pulmonic stenosis. Aorta: The aortic root and ascending aorta are structurally normal,  with no evidence of dilitation. IAS/Shunts: The atrial septum is grossly normal.  LEFT VENTRICLE PLAX 2D LVIDd:         5.30 cm  Diastology LVIDs:         4.55 cm  LV e' lateral:   6.85 cm/s LV PW:         1.30 cm  LV E/e' lateral: 12.2 LV IVS:        1.50 cm  LV e' medial:    4.46 cm/s LVOT diam:     2.40 cm  LV E/e' medial:  18.8 LV SV:         91 LV SV Index:   38 LVOT Area:     4.52 cm  RIGHT VENTRICLE RV Basal diam:  2.30 cm RV S prime:     13.60 cm/s TAPSE (M-mode): 3.0 cm LEFT ATRIUM             Index       RIGHT ATRIUM           Index LA diam:        3.70 cm 1.53 cm/m  RA Area:     11.00 cm LA Vol (A2C):   91.5 ml 37.92 ml/m RA Volume:   25.00 ml  10.36 ml/m LA Vol (A4C):   69.2 ml 28.68 ml/m LA Biplane Vol: 84.4 ml 34.98 ml/m  AORTIC VALVE LVOT Vmax:   119.00 cm/s LVOT Vmean:  64.500 cm/s LVOT VTI:    0.201 m  AORTA Ao Root diam: 3.40 cm MITRAL VALVE MV Area (PHT): 4.89 cm    SHUNTS MV Decel Time: 155 msec    Systemic VTI:  0.20 m MV E velocity: 83.70 cm/s  Systemic Diam: 2.40 cm MV A velocity: 63.40 cm/s MV E/A ratio:  1.32 Mertie Moores MD Electronically signed by Mertie Moores MD Signature Date/Time: 12/14/2019/1:57:50 PM    Final       Medications:     Scheduled Medications: . apixaban  2.5 mg Oral BID  . aspirin  81 mg Oral Daily  . atorvastatin  80 mg Oral q1800  . Chlorhexidine Gluconate Cloth  6 each Topical Daily  . clopidogrel  75 mg Oral Q breakfast  . digoxin  0.125 mg Oral Daily  . mouth rinse  15 mL Mouth Rinse BID  . metoprolol tartrate  12.5 mg Oral BID  . sodium chloride  flush  3 mL Intravenous Q12H     Infusions: . sodium chloride    . nitroGLYCERIN Stopped (12/15/19 0235)     PRN Medications:  sodium chloride, acetaminophen, nitroGLYCERIN, ondansetron (ZOFRAN) IV, oxyCODONE, sodium chloride flush   Assessment/Plan:   1.  CAD with acute anterior myocardial infarction - Cath 3/12 with proximal occlusion of the LAD and distal RCA occlusion with  left-to-right collaterals thought to be chronic.  Status post emergent PCI of the LAD. - Hs troponin > 27,000 -Continue DAPT and statin.  He is tolerating low-dose beta-blocker. Will switch to carvedilol -Has been on low-dose apixaban for remote history of DVT/PE.   - Will treat with low-dose apixaban as part of triple therapy for 1 month.  Can drop aspirin after 30 days.  Discussed with Pharm.D. personally - Consult CR  2. Acute systolic HF due to iCM - EF estimated at 35-45 ventriculogram.   - Echo 12/14/19 EF of 25 to 30%. - IABP ou - Continue digoxin. Start spironolactone  - Add losartan 25 daily. Can switch to [facility] as BP tolerates (had several SBPs < 100 overnight) -Consider SGLT2 inhibitor.  3.  Acute hypoxic respiratory failure post cardiac catheterization -Due to acute pulmonary edema. -Now resolved. -Start spiro 12.5. Can add low-dose daily lasix as needed but looks dry now  4.  Remote history of DVT/PE -Anticoagulation as above.  5.  AKI. -Creatinine 1.9 on admission.  - 1.3 -> 1.45 overnight with diuresis.  - Hold diuretics for now  Can go to the floor.     Length of Stay: 2   Glori Bickers MD 12/16/2019, 10:04 AM  Advanced Heart Failure Team Pager [phone] (M-F; 7a - 4p)  Please contact [facility] for night-coverage after hours (4p -7a ) and weekends on [email or web address]

## 2019-12-17 LAB — BASIC METABOLIC PANEL
Anion gap: 11 (ref 5–15)
BUN: 25 mg/dL — ABNORMAL HIGH (ref 8–23)
CO2: 25 mmol/L (ref 22–32)
Calcium: 8.7 mg/dL — ABNORMAL LOW (ref 8.9–10.3)
Chloride: 96 mmol/L — ABNORMAL LOW (ref 98–111)
Creatinine, Ser: 1.41 mg/dL — ABNORMAL HIGH (ref 0.61–1.24)
GFR calc Af Amer: 59 mL/min — ABNORMAL LOW (ref >60)
GFR calc non Af Amer: 51 mL/min — ABNORMAL LOW (ref >60)
Glucose, Bld: 95 mg/dL (ref 70–99)
Potassium: 4 mmol/L (ref 3.5–5.1)
Sodium: 132 mmol/L — ABNORMAL LOW (ref 135–145)

## 2019-12-17 LAB — CBC
HCT: 45.2 % (ref 39.0–52.0)
Hemoglobin: 15.9 g/dL (ref 13.0–17.0)
MCH: 34.4 pg — ABNORMAL HIGH (ref 26.0–34.0)
MCHC: 35.2 g/dL (ref 30.0–36.0)
MCV: 97.8 fL (ref 80.0–100.0)
Platelets: 201 10*3/uL (ref 150–400)
RBC: 4.62 MIL/uL (ref 4.22–5.81)
RDW: 12.1 % (ref 11.5–15.5)
WBC: 11.5 10*3/uL — ABNORMAL HIGH (ref 4.0–10.5)
nRBC: 0 % (ref 0.0–0.2)

## 2019-12-17 MED ORDER — ELIQUIS 2.5 MG PO TABS: 2.5000 mg | ORAL_TABLET | Freq: Two times a day (BID) | ORAL | 5 refills | Status: DC

## 2019-12-17 MED ORDER — DIGOXIN 125 MCG PO TABS: 0.1250 mg | ORAL_TABLET | Freq: Every day | ORAL | 5 refills | Status: DC

## 2019-12-17 MED ORDER — CARVEDILOL 3.125 MG PO TABS: 3.1250 mg | ORAL_TABLET | Freq: Two times a day (BID) | ORAL | 5 refills | Status: DC

## 2019-12-17 MED ORDER — ASPIRIN 81 MG PO CHEW: 81.0000 mg | CHEWABLE_TABLET | Freq: Every day | ORAL | 0 refills | Status: AC

## 2019-12-17 MED ORDER — SPIRONOLACTONE 25 MG PO TABS: 12.5000 mg | ORAL_TABLET | Freq: Every day | ORAL | 5 refills | Status: DC

## 2019-12-17 MED ORDER — NITROGLYCERIN 0.4 MG SL SUBL: 0.4000 mg | SUBLINGUAL_TABLET | SUBLINGUAL | 3 refills | Status: DC | PRN

## 2019-12-17 MED ORDER — LOSARTAN POTASSIUM 25 MG PO TABS: 25.0000 mg | ORAL_TABLET | Freq: Every day | ORAL | 5 refills | Status: DC

## 2019-12-17 MED ORDER — CLOPIDOGREL BISULFATE 75 MG PO TABS: 75.0000 mg | ORAL_TABLET | Freq: Every day | ORAL | 11 refills | Status: DC

## 2019-12-17 MED ORDER — ATORVASTATIN CALCIUM 80 MG PO TABS: 80.0000 mg | ORAL_TABLET | Freq: Every day | ORAL | 5 refills | Status: DC

## 2019-12-17 NOTE — Discharge Summary (Addendum)
Advanced Heart Failure Team  Discharge Summary   Patient ID: Thomas Carpenter MRN: [id number], DOB/AGE: [birth year] 68 y.o. 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   Emergent cardiac catheterization showed a proximally occluded LAD is distal occlusion of the RCA with left-to-right collaterals.  Ejection fraction was estimated at 35-45% by ventriculogram.  Post catheterization he developed acute respiratory distress with an EDP of 36 and an IABP was inserted.  Peak high-sensitivity troponin was greater than 27,000.   Echocardiogram on 3/12: Showed an EF of 25 to 30%.  The RV was normal. 3/13, he was weaned off of IABP and remained stable. Had AKI w/ SCr peaking at 1.9 but improved down to 1.4. No recurrent anginal symptomatolgy post PCI. Was placed on DAPT w/ ASA + Plavix (also low dose Eliquis for VTE prophylaxis). He will remain on triple therapy x 30 days, then will stop ASA after 1 month of therapy. High intensity statin was added, atorvastatin 80 (LDL 106 mg/dL). He tolerated low dose ARB, ? blocker and spironolactone. Vital signs remained stable but BP too soft for Entesto.  Was transferred out of ICU to progressive care unit and remained stable. He progressed w/ cardiac rehab w/o exertional symptoms. Rhythm remained stable on tele. No signs of volume overload.    He was seen and examined by Dr. Haroldine Laws on 3/15 and felt stable for discharge home. He was discharged home on the following meds below. [facility] f/u has been arranged and he will be referred for outpatient cardiac rehab.   Cardiac Meds for Home ASA 81 mg x 30 days Plavix 75 mg qd Eliquis 2.5 mg bid Atorvastatin 80 mg qhs Coreg 3.125 mg bid Losartan 25 mg qd Spironolactone 12.5 mg qd Digoxin 0.125 mg Lasix 40 mg PRN if wt gain or edema    Cardiac Studies  Emergent [facility] 12/13/19 Conclusion    A stent was successfully placed. A stent was successfully placed.   Anteroseptal myocardial infarction 12 occlusion of the proximal LAD. Successful PCI with overlapping stent from ostial LAD to mid vessel using 3.0, 26 and 34 mm long Onyx stents deployed at 14 atm.  0% with initial TIMI grade II flow that improved to TIMI grade III after intracoronary nitroglycerin. 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 Widely patent left main. Widely patent ramus and circumflex. Acute on chronic combined systolic and diastolic heart failure with LVEDP 34 mmHg.  Estimated ejection fraction 40%. Intra-aortic balloon pump placed post stenting because of developing pulmonary edema and high left ventricular filling pressures.     2D Echo 12/23/19 1. Left ventricular ejection fraction, by estimation, is 25 to 30%. The left ventricle has severely decreased function. The left ventricle demonstrates regional wall motion abnormalities (see scoring diagram/findings for description). There is moderate asymmetric left ventricular hypertrophy. Left ventricular diastolic parameters are consistent with Grade II diastolic dysfunction (pseudonormalization). There  is severe akinesis of the left ventricular, entire anteroseptal wall. 2. Right ventricular systolic function is normal. The right ventricular size is normal. 3. Left atrial size was mildly dilated. 4. The mitral valve is normal in  structure. Trivial mitral valve regurgitation. No evidence of mitral stenosis. 5. The aortic valve is normal in structure. Aortic valve regurgitation is not visualized. No aortic stenosis is present.    Discharge Weight Range: 269 lb Discharge Vitals: Blood pressure 95/63, pulse 77, temperature 98 F (36.7 C), temperature source Oral, resp. rate 18, height 5' 9"  (1.753 m), weight 122.2 kg, SpO2 94 %.  Labs: Lab Results  Component Value Date   WBC 11.5 (H) 12/17/2019   HGB 15.9 12/17/2019   HCT 45.2 12/17/2019   MCV 97.8 12/17/2019   PLT 201 12/17/2019    Recent Labs  Lab 12/13/19 2125 12/14/19 0215 12/17/19 0249  NA 136   < > 132*  K 3.6   < > 4.0  CL 102   < > 96*  CO2 22   < > 25  BUN 29*   < > 25*  CREATININE 1.90*   < > 1.41*  CALCIUM 9.1   < > 8.7*  PROT 7.3  --   --   BILITOT 0.6  --   --   ALKPHOS 70  --   --   ALT 27  --   --   AST 31  --   --   GLUCOSE 132*   < > 95   < > = values in this interval not displayed.   Lab Results  Component Value Date   CHOL 167 12/16/2019   HDL 42 12/16/2019   LDLCALC 106 (H) 12/16/2019   TRIG 96 12/16/2019   BNP (last 3 results) No results for input(s): BNP in the last 8760 hours.  ProBNP (last 3 results) No results for input(s): PROBNP in the last 8760 hours.   Diagnostic Studies/Procedures   DG CHEST PORT 1 VIEW  Result Date: 12/16/2019 CLINICAL DATA:  Heart failure.  Follow-up exam. EXAM: PORTABLE CHEST 1 VIEW COMPARISON:  12/15/2019 FINDINGS: Cardiac silhouette is normal in size. No mediastinal or hilar masses. Intra-aortic balloon pump has been removed. Lungs are clear.  No convincing pleural effusion.  No pneumothorax. IMPRESSION: 1. No acute cardiopulmonary disease. Electronically Signed   By: Lajean Manes M.D.   On: 12/16/2019 08:14    Discharge Medications   Allergies as of 12/17/2019   No Known Allergies      Medication List     STOP taking these medications    metoprolol tartrate 50 MG  tablet Commonly known as: LOPRESSOR   VITAMIN B-12 PO       TAKE these medications    aspirin 81 MG chewable tablet Chew 1 tablet (81 mg total) by mouth daily. Start taking on: December 18, 2019   atorvastatin 80 MG tablet Commonly known as: LIPITOR Take 1 tablet (80 mg total) by mouth daily at 6 PM.   carvedilol 3.125 MG tablet Commonly known as: COREG Take 1 tablet (3.125 mg total) by mouth 2 (two) times daily with a meal.   clopidogrel 75 MG tablet Commonly known as: PLAVIX Take 1 tablet (75 mg total) by mouth daily with breakfast. Start taking on: December 18, 2019   digoxin 0.125 MG tablet Commonly known as: LANOXIN Take 1 tablet (0.125 mg total) by mouth daily. Start taking on: December 18, 2019   Eliquis 2.5 MG Tabs tablet Generic drug:  apixaban Take 1 tablet (2.5 mg total) by mouth 2 (two) times daily.   losartan 25 MG tablet Commonly known as: COZAAR Take 1 tablet (25 mg total) by mouth daily. Start taking on: December 18, 2019   nitroGLYCERIN 0.4 MG SL tablet Commonly known as: NITROSTAT Place 1 tablet (0.4 mg total) under the tongue every 5 (five) minutes x 3 doses as needed for chest pain.   spironolactone 25 MG tablet Commonly known as: ALDACTONE Take 0.5 tablets (12.5 mg total) by mouth daily. Start taking on: December 18, 2019 What changed:  how much to take when to take this   Vitamin D3 125 MCG (5000 UT) Caps Take 5,000 Units by mouth daily with breakfast.        Disposition   The patient will be discharged in stable condition to home. Discharge Instructions     Amb Referral to Cardiac Rehabilitation   Complete by: As directed    Will send Cardiac Rehab Phase 2 referral to [place]   Diagnosis:  Coronary Stents STEMI     After initial evaluation and assessments completed: Virtual Based Care may be provided alone or in conjunction with Phase 2 Cardiac Rehab based on patient barriers.: Yes      Follow-up Information     [facility] Follow up on 12/31/2019.   Specialty: Cardiology Why: 2:30 PM  [facility] [place] Code [phone]  Contact information: [address] [id number] [place] [phone]             Duration of Discharge Encounter: Greater than 35 minutes   Signed, Nelida Gores 12/17/2019, 4:56 PM  Patient seen and examined with the above-signed Advanced Practice Provider and/or Housestaff. I personally reviewed laboratory data, imaging studies and relevant notes. I independently examined the patient and formulated the important aspects of the plan. I have edited the note to reflect any of my changes or salient points. I have personally discussed the plan with the patient and/or family.  He is ready for d/c. Please see daily rounding note for further details.  Glori Bickers, MD  10:32 PM

## 2019-12-17 NOTE — Progress Notes (Signed)
Patient with discharge order, patient waiting on son as he will be his transportation home, Patient given discharge instructions medication lis with next dose due, t and follow up appointments. Patient personal prescriptions sent to personal pharmacy.  Patient verbalized understanding. Patient IV and tele were dcd. Will discharge home as ordered. Will be transported to exit via wheel chair and nursing staff. Tauheedah Bok, Bettina Gavia RN

## 2019-12-17 NOTE — Progress Notes (Addendum)
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 S/p emergent PCI + DES to prox LAD. EF 25-30%. IAPB removed 3/13.   Resting comfortably in bed. "Slept well last night". Denies CP and dyspnea. Ambulating w/o exertional symptoms.    Objective:   Weight Range:  Vital Signs:   Temp:  [97.6 F (36.4 C)-98.4 F (36.9 C)] 98.2 F (36.8 C) (03/15 0316) Pulse Rate:  [66-76] 69 (03/15 0316) Resp:  [12-26] 20 (03/15 0316) BP: (91-141)/(50-93) 91/62 (03/15 0316) SpO2:  [93 %-99 %] 95 % (03/15 0316) Last BM Date: 12/16/19  Weight change: Filed Weights   12/13/19 2117 12/15/19 0330 12/16/19 0600  Weight: 130.7 kg 126 kg 122.2 kg    Intake/Output:   Intake/Output Summary (Last 24 hours) at 12/17/2019 0735 Last data filed at 12/16/2019 2025 Gross per 24 hour  Intake 897 ml  Output 1275 ml  Net -378 ml     PHYSICAL EXAM: General:  Well appearing, obese WM. No respiratory difficulty HEENT: normal anicteric Neck: supple. Thick neck, JVD assessment difficult . Carotids 2+ bilat; no bruits. No lymphadenopathy or thyromegaly appreciated. Cor: PMI nondisplaced. Regular rate & rhythm. No rubs, gallops or murmurs. Lungs: clear Abdomen: obese, soft, nontender, nondistended. No hepatosplenomegaly. No bruits or masses. Good bowel sounds. Extremities: no cyanosis, clubbing, rash, edema Neuro: alert & oriented x 3, cranial nerves grossly intact. moves all 4 extremities w/o difficulty. Affect pleasant     Telemetry: NSR 60s-70s  Personally reviewed  Labs: Basic Metabolic Panel: Recent Labs  Lab 12/13/19 2125 12/13/19 2125 12/14/19 0215 12/14/19 0215 12/15/19 1056 12/16/19 0147 12/17/19 0249  NA 136  --  136  --  134* 135 132*  K 3.6  --  4.3  --  4.6 3.9 4.0  CL 102  --  100  --  99 97* 96*  CO2 22  --  21*  --  24 28 25   GLUCOSE 132*  --   123*  --  99 104* 95  BUN 29*  --  31*  --  18 20 25*  CREATININE 1.90*  --  1.81*  --  1.30* 1.45* 1.41*  CALCIUM 9.1   < > 9.2   < > 8.5* 8.8* 8.7*   < > = values in this interval not displayed.    Liver Function Tests: Recent Labs  Lab 12/13/19 2125  AST 31  ALT 27  ALKPHOS 70  BILITOT 0.6  PROT 7.3  ALBUMIN 3.7   No results for input(s): LIPASE, AMYLASE in the last 168 hours. No results for input(s): AMMONIA in the last 168 hours.  CBC: Recent Labs  Lab 12/13/19 2125 12/14/19 0215 12/15/19 0324 12/16/19 0147 12/17/19 0249  WBC 12.7* 14.3* 9.8 10.6* 11.5*  NEUTROABS 5.9  --   --   --   --   HGB 17.1* 17.6* 16.3 16.2 15.9  HCT 49.4 50.3 47.2 46.3 45.2  MCV 100.6* 97.9 97.5 98.1 97.8  PLT 287 314 227 198 201    Cardiac Enzymes: No results for input(s): CKTOTAL, CKMB, CKMBINDEX, TROPONINI in the last 168 hours.  BNP: BNP (last 3 results) No results for input(s): BNP in the last 8760 hours.  ProBNP (last 3 results) No results for input(s): PROBNP in the last 8760 hours.    Other results:  Imaging: DG CHEST PORT 1 VIEW  Result Date: 12/16/2019 CLINICAL DATA:  Heart failure.  Follow-up exam. EXAM: PORTABLE CHEST 1 VIEW COMPARISON:  12/15/2019 FINDINGS: Cardiac silhouette is normal in size. No mediastinal or hilar masses. Intra-aortic balloon pump has been removed. Lungs are clear.  No convincing pleural effusion.  No pneumothorax. IMPRESSION: 1. No acute cardiopulmonary disease. Electronically Signed   By: Lajean Manes M.D.   On: 12/16/2019 08:14     Medications:     Scheduled Medications: . apixaban  2.5 mg Oral BID  . aspirin  81 mg Oral Daily  . atorvastatin  80 mg Oral q1800  . carvedilol  3.125 mg Oral BID WC  . Chlorhexidine Gluconate Cloth  6 each Topical Daily  . clopidogrel  75 mg Oral Q breakfast  . digoxin  0.125 mg Oral Daily  . losartan  25 mg Oral Daily  . mouth rinse  15 mL Mouth Rinse BID  . spironolactone  12.5 mg Oral Daily     Infusions: . sodium chloride      PRN Medications: sodium chloride, acetaminophen, nitroGLYCERIN, ondansetron (ZOFRAN) IV, oxyCODONE, sodium chloride flush   Assessment/Plan:   1.  CAD with acute anterior myocardial infarction - Cath 3/12 with proximal occlusion of the LAD and distal RCA occlusion with left-to-right collaterals thought to be chronic.  Status post emergent PCI of the LAD. - Hs troponin > 27,000 - no further anginal symptomatology  - Continue DAPT w/ ASA + Plavix.  - Has been on low-dose apixaban for remote history of DVT/PE.   - Will treat with low-dose apixaban as part of triple therapy for 1 month.  Can drop aspirin after 30 days.   - continue  blocker therapy w/ carvedilol 3.125 mg bid  - on high dose statin therapy, atorvastatin 80 mg qhs. LDL 106 mg/dL. Goal < 70. Recheck FLP + HFTs in 6-8 weeks. If not at goal, can add Zetia +/- PCSK9i therapy  - continue spironolactone 12.5 mg qd  - Ambulate w/ CR. Will refer to outpatient CR  2. Acute systolic HF due to iCM - EF estimated at 35-45 ventriculogram.   - Echo 12/14/19 EF of 25 to 30%. - IABP removed 3/13 - Volume status stable  - Continue digoxin 0.125 mg - Continue spironolactone 12.5 mg  - Continue losartan 25 daily. Can switch to Entresto as BP tolerates - Consider future addition of SGLT2 inhibitor. - Will discharge home w/ PRN lasix (40 mg) if wt gain - Will need gradual titration of HF regimen as outpatient and repeat echo after 3 months of maximum GDMT.   3.  Acute hypoxic respiratory failure post cardiac catheterization -Due to acute pulmonary edema. -Now resolved. No respiratory difficulty.  -Continue spiro 12.5.  -Add PRN lasix as outlined above  4.  Remote history of DVT/PE -Anticoagulation as above. -On reduce prophylaxis Eliquis dose 2.5 mg bid   5.  AKI. - unknown baseline  -Creatinine 1.9 on admission.  - overall improved and appears to be stabilizing 1.3 -> 1.45-> 1.4 - We  will repeat BMP at post hospital f/u.   Plan d/c home today. F/u has been arranged.   Length of Stay: 3   Brittainy Ladoris Gene 12/17/2019, 7:35 AM  Advanced Heart Failure Team Pager [phone] (M-F; 7a - 4p)  Please contact [facility] for night-coverage after hours (4p -7a ) and weekends on [email or web address]  Patient seen and examined with the above-signed Advanced Practice Provider and/or Housestaff. I personally reviewed laboratory data, imaging studies and relevant  notes. I independently examined the patient and formulated the important aspects of the plan. I have edited the note to reflect any of my changes or salient points. I have personally discussed the plan with the patient and/or family.  Looks great today. BP soft but ok. No CP.  Volume status ok.    [facility] for d/c today. Arrange f/u with me. Also will need CR.   Meds reviewed personally.   Glori Bickers, MD  2:41 PM

## 2019-12-17 NOTE — Progress Notes (Signed)
CARDIAC REHAB PHASE I   PRE:  Rate/Rhythm: 83 SR  BP:  Sitting: 97/52      SaO2: 95 RA  MODE:  Ambulation: 950 ft   POST:  Rate/Rhythm: 103 ST  BP:  Sitting: 120/69    SaO2: 96 RA  Pt ambulated 911ft in hallway independently with slow, steady gait. Pt denies CP, SOB, or dizziness. Pt educated on importance of ASA, Plavix, and NTG. Pt given MI book and HF booklet, along with heart healthy and low sodium diets. Reviewed importance of daily weights, restrictions, site care, and exercise guidelines. Will refer to CRP II [place].  [id number] Rufina Falco, RN BSN 12/17/2019 9:56 AM

## 2019-12-31 ENCOUNTER — Other Ambulatory Visit: Payer: Self-pay

## 2019-12-31 ENCOUNTER — Ambulatory Visit (HOSPITAL_COMMUNITY)
Admit: 2019-12-31 | Discharge: 2019-12-31 | Disposition: A | Discharge status: Home / Self Care | Payer: Medicare Other | Source: Ambulatory Visit | Attending: Cardiology | Admitting: Cardiology

## 2019-12-31 ENCOUNTER — Encounter (HOSPITAL_COMMUNITY): Payer: Self-pay

## 2019-12-31 VITALS — BP 148/102 | HR 56 | Wt 275.0 lb

## 2019-12-31 DIAGNOSIS — Z79899 Other long term (current) drug therapy: Secondary | ICD-10-CM | POA: Diagnosis not present

## 2019-12-31 DIAGNOSIS — Z86718 Personal history of other venous thrombosis and embolism: Secondary | ICD-10-CM | POA: Insufficient documentation

## 2019-12-31 DIAGNOSIS — Z86711 Personal history of pulmonary embolism: Secondary | ICD-10-CM | POA: Insufficient documentation

## 2019-12-31 DIAGNOSIS — Z9861 Coronary angioplasty status: Secondary | ICD-10-CM | POA: Diagnosis not present

## 2019-12-31 DIAGNOSIS — I110 Hypertensive heart disease with heart failure: Secondary | ICD-10-CM | POA: Diagnosis not present

## 2019-12-31 DIAGNOSIS — Z7901 Long term (current) use of anticoagulants: Secondary | ICD-10-CM | POA: Insufficient documentation

## 2019-12-31 DIAGNOSIS — I2109 ST elevation (STEMI) myocardial infarction involving other coronary artery of anterior wall: Secondary | ICD-10-CM | POA: Insufficient documentation

## 2019-12-31 DIAGNOSIS — Z955 Presence of coronary angioplasty implant and graft: Secondary | ICD-10-CM | POA: Diagnosis not present

## 2019-12-31 DIAGNOSIS — I5021 Acute systolic (congestive) heart failure: Secondary | ICD-10-CM | POA: Diagnosis not present

## 2019-12-31 DIAGNOSIS — Z7982 Long term (current) use of aspirin: Secondary | ICD-10-CM | POA: Diagnosis not present

## 2019-12-31 DIAGNOSIS — I5042 Chronic combined systolic (congestive) and diastolic (congestive) heart failure: Secondary | ICD-10-CM

## 2019-12-31 DIAGNOSIS — I2510 Atherosclerotic heart disease of native coronary artery without angina pectoris: Secondary | ICD-10-CM | POA: Diagnosis not present

## 2019-12-31 DIAGNOSIS — Z7902 Long term (current) use of antithrombotics/antiplatelets: Secondary | ICD-10-CM | POA: Diagnosis not present

## 2019-12-31 DIAGNOSIS — R001 Bradycardia, unspecified: Secondary | ICD-10-CM | POA: Diagnosis not present

## 2019-12-31 LAB — CBC
HCT: 44.7 % (ref 39.0–52.0)
Hemoglobin: 15.4 g/dL (ref 13.0–17.0)
MCH: 34.5 pg — ABNORMAL HIGH (ref 26.0–34.0)
MCHC: 34.5 g/dL (ref 30.0–36.0)
MCV: 100 fL (ref 80.0–100.0)
Platelets: 272 10*3/uL (ref 150–400)
RBC: 4.47 MIL/uL (ref 4.22–5.81)
RDW: 11.9 % (ref 11.5–15.5)
WBC: 6.5 10*3/uL (ref 4.0–10.5)
nRBC: 0 % (ref 0.0–0.2)

## 2019-12-31 LAB — DIGOXIN LEVEL: Digoxin Level: 0.5 ng/mL — ABNORMAL LOW (ref 0.8–2.0)

## 2019-12-31 MED ORDER — PANTOPRAZOLE SODIUM 40 MG PO TBEC: 40.0000 mg | DELAYED_RELEASE_TABLET | Freq: Every day | ORAL | 0 refills | Status: DC

## 2019-12-31 MED ORDER — ENTRESTO 24-26 MG PO TABS: 1.0000 | ORAL_TABLET | Freq: Two times a day (BID) | ORAL | 5 refills | Status: DC

## 2019-12-31 NOTE — Progress Notes (Addendum)
Advanced Heart Failure Clinic Note   Referring Physician: PCP: System, Pcp Not In PCP-Cardiologist: Dr. 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  Emergent cardiac catheterization showed a proximally occluded LAD treated w/ successful PCI + DES from prox to mid LAD. Also found to have distal occlusion of the RCA with left-to-right collaterals. Ejection fraction was estimated at 35-45% by ventriculogram. Post catheterization he developed acute respiratory distress with an EDP of 36 and an IABP was inserted. Peak high-sensitivity troponin was greater than 27,000.  Echocardiogram on 3/12: Showed an EF of 25 to 30%. The RV was normal. 3/13, he was weaned off of IABP and remained stable. Had AKI w/ SCr peaking at 1.9 but improved down to 1.4. No recurrent anginal symptomatolgy post PCI. Was placed on DAPT w/ ASA + Plavix (also low dose Eliquis for VTE prophylaxis). He will remain on triple therapy x 30 days, then will stop ASA after 1 month of therapy. High intensity statin was added, atorvastatin 80 (LDL 106 mg/dL). He tolerated low dose ARB, ? blocker and spironolactone. Vital signs remained stable but BP too soft for Entesto.  Was transferred out of ICU to progressive care unit and remained stable. He progressed w/ cardiac rehab w/o exertional symptoms. Rhythm remained stable on tele. No signs of volume overload. Discharged home on 3/15.   He presents to clinic for post hospital f/u. Doing well today. No complaints. Denies CP. No resting nor exertional dyspnea. Denies LEE, orthopnea/PND. Reports full med compliance. No side effects. Denies abnormal bleeding on triple therapy. BP elevated 142/101 initially. BP repeated after period of rest and was 118/80. EKG shows sinus bradycardia, 52 bpm. Asymptomatic.  Denies fatigue.  No  dizziness or lightheadedness.     Review of Systems: [y] = yes, [ ]  = no   General: Weight gain [ ] ; Weight loss [ ] ; Anorexia [ ] ; Fatigue [ ] ; Fever [ ] ; Chills [ ] ; Weakness [ ]   Cardiac: Chest pain/pressure [ ] ; Resting SOB [ ] ; Exertional SOB [ ] ; Orthopnea [ ] ; Pedal Edema [ ] ; Palpitations [ ] ; Syncope [ ] ; Presyncope [ ] ; Paroxysmal nocturnal dyspnea[ ]   Pulmonary: Cough [ ] ; Wheezing[ ] ; Hemoptysis[ ] ; Sputum [ ] ; Snoring [ ]   GI: Vomiting[ ] ; Dysphagia[ ] ; Melena[ ] ; Hematochezia [ ] ; Heartburn[ ] ; Abdominal pain [ ] ; Constipation [ ] ; Diarrhea [ ] ; BRBPR [ ]   GU: Hematuria[ ] ; Dysuria [ ] ; Nocturia[ ]   Vascular: Pain in legs with walking [ ] ; Pain in feet with lying flat [ ] ; Non-healing sores [ ] ; Stroke [ ] ; TIA [ ] ; Slurred speech [ ] ;  Neuro: Headaches[ ] ; Vertigo[ ] ; Seizures[ ] ; Paresthesias[ ] ;Blurred vision [ ] ; Diplopia [ ] ; Vision changes [ ]   Ortho/Skin: Arthritis [ ] ; Joint pain [ ] ; Muscle pain [ ] ; Joint swelling [ ] ; Back Pain [ ] ; Rash [ ]   Psych: Depression[ ] ; Anxiety[ ]   Heme: Bleeding problems [ ] ; Clotting disorders [ ] ; Anemia [ ]   Endocrine: Diabetes [ ] ; Thyroid dysfunction[ ]    Past Medical History:  Diagnosis Date  . DVT (deep venous thrombosis) ([place])   . Hypertension   . Pneumonia   . Pulmonary emboli [facility])     Current Outpatient Medications  Medication Sig Dispense Refill  . aspirin 81 MG chewable tablet Chew 1 tablet (81 mg total)  by mouth daily. 30 tablet 0  . atorvastatin (LIPITOR) 80 MG tablet Take 1 tablet (80 mg total) by mouth daily at 6 PM. 30 tablet 5  . carvedilol (COREG) 3.125 MG tablet Take 1 tablet (3.125 mg total) by mouth 2 (two) times daily with a meal. 60 tablet 5  . Cholecalciferol (VITAMIN D3) 125 MCG (5000 UT) CAPS Take 5,000 Units by mouth daily with breakfast.    . clopidogrel (PLAVIX) 75 MG tablet Take 1 tablet (75 mg total) by mouth daily with breakfast. 30 tablet 11  . digoxin (LANOXIN) 0.125 MG tablet Take 1 tablet  (0.125 mg total) by mouth daily. 30 tablet 5  . ELIQUIS 2.5 MG TABS tablet Take 1 tablet (2.5 mg total) by mouth 2 (two) times daily. 60 tablet 5  . spironolactone (ALDACTONE) 25 MG tablet Take 0.5 tablets (12.5 mg total) by mouth daily. 30 tablet 5  . nitroGLYCERIN (NITROSTAT) 0.4 MG SL tablet Place 1 tablet (0.4 mg total) under the tongue every 5 (five) minutes x 3 doses as needed for chest pain. (Patient not taking: Reported on 12/31/2019) 25 tablet 3  . pantoprazole (PROTONIX) 40 MG tablet Take 1 tablet (40 mg total) by mouth daily for 14 days. 14 tablet 0  . sacubitril-valsartan (ENTRESTO) 24-26 MG Take 1 tablet by mouth 2 (two) times daily. 60 tablet 5   No current facility-administered medications for this encounter.    No Known Allergies    Social History   Socioeconomic History  . Marital status: Single    Spouse name: Not on file  . Number of children: Not on file  . Years of education: Not on file  . Highest education level: Not on file  Occupational History  . Not on file  Tobacco Use  . Smoking status: Never Smoker  . Smokeless tobacco: Current User  Substance and Sexual Activity  . Alcohol use: Yes    Comment: daily  . Drug use: Never  . Sexual activity: Not on file  Other Topics Concern  . Not on file  Social History Narrative  . Not on file   Social Determinants of Health   Financial Resource Strain:   . Difficulty of Paying Living Expenses:   Food Insecurity:   . Worried About Charity fundraiser in the Last Year:   . Arboriculturist in the Last Year:   Transportation Needs:   . Film/video editor (Medical):   Marland Kitchen Lack of Transportation (Non-Medical):   Physical Activity:   . Days of Exercise per Week:   . Minutes of Exercise per Session:   Stress:   . Feeling of Stress :   Social Connections:   . Frequency of Communication with Friends and Family:   . Frequency of Social Gatherings with Friends and Family:   . Attends Religious Services:   .  Active Member of Clubs or Organizations:   . Attends Archivist Meetings:   Marland Kitchen Marital Status:   Intimate Partner Violence:   . Fear of Current or Ex-Partner:   . Emotionally Abused:   Marland Kitchen Physically Abused:   . Sexually Abused:      No family history on file.  Vitals:   12/31/19 1441  BP: (!) 148/102  Pulse: (!) 56  SpO2: 98%  Weight: 124.7 kg (275 lb)     PHYSICAL EXAM: General:  Well appearing WM. Moderately obese, No respiratory difficulty HEENT: normal Neck: supple. no JVD. Carotids 2+ bilat; no bruits. No  lymphadenopathy or thyromegaly appreciated. Cor: PMI nondisplaced. Regular rate & rhythm. No rubs, gallops or murmurs. Lungs: clear Abdomen: obese, soft, n ontender, nondistended. No hepatosplenomegaly. No bruits or masses. Good bowel sounds. Extremities: no cyanosis, clubbing, rash, trace bilateral LE edema Neuro: alert & oriented x 3, cranial nerves grossly intact. moves all 4 extremities w/o difficulty. Affect pleasant.  ECG: sinus bradycardia, 52 bpm    ASSESSMENT & PLAN:  1. CAD: - s/p anterior MI 12/14/19. Hs troponin > 27,000. Cath with proximal occlusion of the LAD and distal RCA occlusion with left-to-right collaterals (thought to be chronic). Status post emergent PCI of the LAD. - no recurrent angina  - Continue triple therapy w/ ASA + Plavix + Eliquis (Has been on low-dose apixaban for remote history of DVT/PE).  Stop ASA after 30 days of therapy (stop on 4/12). He denies abnormal bleeding. Will check CBC today. Will add PPI for GI protection for the next 2 weeks while on triple therapy to reduce risk of potential GIB. Start Protonix 40 mg qd until 4/12. - continue ? blocker therapy w/ carvedilol 3.125 mg bid. HR limits further titration (52 bpm)  - on high dose statin therapy, atorvastatin 80 mg qhs. Recent LDL 106 mg/dL. Goal < 70. Recheck FLP + HFTs in 6 weeks. If not at goal, can add Zetia +/- PCSK9i therapy  - continue spironolactone 12.5 mg qd   - Refer to outpatient cardiac rehab   2. Acute systolic HF due to iCM - EF estimated at 35-45 ventriculogram.  - Echo 12/14/19 EF of 25 to 30%. - Volume status stable. NYHA II - Continue digoxin 0.125 mg. Check digoxin level today  - Continue spironolactone 12.5 mg  - Stop Losartan and start Entresto 24-26 bid  - Continue Coreg 3.125 mg bid. HR limits further titration  - Consider future addition of SGLT2 inhibitor. - Will need gradual titration of HF regimen and repeat echo after 3 months of maximum GDMT. If EF remains < 35%, refer to EP for ICD consideration - See pharmD in 3 weeks and Dr. Haroldine Laws in 6 weeks for further med titration  - Check BMP today and again in 7 days (adding entresto)    3. Remote history of DVT/PE -Anticoagulation as above. -On reduce prophylaxis Eliquis dose 2.5 mg bid  -Check CBC today   F/u w/ pharmD in 2-3 weeks for further med titration    Lyda Jester, PA-C 12/31/19

## 2019-12-31 NOTE — Patient Instructions (Signed)
Labs done today. We will contact you only if your labs are abnormal.  STOP Asprin on April 12th, 2021  STOP Losartan  START Delene Loll 24-26mg (1 tablet) by mouth two times daily  START Pantoprazole(Protonix) 40mg (1 tablet) by mouth for 2 weeks(14 days).   No other medication changes were made. Please continue all other current medication as prescribed.  Your physician recommends that you schedule a follow-up appointment in: 7-10days for a lab only appointment and in 3 weeks for an appointment with our Clinical Pharmacist and in 6 weeks with Dr. Haroldine Laws.   Do the following things EVERYDAY: 1) Weigh yourself in the morning before breakfast. Write it down and keep it in a log. 2) Take your medicines as prescribed 3) Eat low salt foods--Limit salt (sodium) to 2000 mg per day.  4) Stay as active as you can everyday 5) Limit all fluids for the day to less than 2 liters   At the [facility], you and your health needs are our priority. As part of our continuing mission to provide you with exceptional heart care, we have created designated Provider Care Teams. These Care Teams include your primary Cardiologist (physician) and Advanced Practice Providers (APPs- Physician Assistants and Nurse Practitioners) who all work together to provide you with the care you need, when you need it.   You may see any of the following providers on your designated Care Team at your next follow up: Marland Kitchen Dr Glori Bickers . Dr Loralie Champagne . Darrick Grinder, NP . Lyda Jester, PA . Audry Riles, PharmD   Please be sure to bring in all your medications bottles to every appointment.

## 2019-12-31 NOTE — Addendum Note (Signed)
Encounter addended by: Consuelo Pandy, PA-C on: 12/31/2019 8:02 PM  Actions taken: Clinical Note Signed

## 2020-01-01 DIAGNOSIS — Z955 Presence of coronary angioplasty implant and graft: Secondary | ICD-10-CM

## 2020-01-09 ENCOUNTER — Other Ambulatory Visit: Payer: Self-pay

## 2020-01-09 ENCOUNTER — Ambulatory Visit (HOSPITAL_COMMUNITY)
Admission: RE | Admit: 2020-01-09 | Discharge: 2020-01-09 | Disposition: A | Discharge status: Home / Self Care | Payer: Medicare Other | Source: Ambulatory Visit | Attending: Cardiology | Admitting: Cardiology

## 2020-01-09 DIAGNOSIS — I5042 Chronic combined systolic (congestive) and diastolic (congestive) heart failure: Secondary | ICD-10-CM | POA: Insufficient documentation

## 2020-01-09 LAB — BASIC METABOLIC PANEL
Anion gap: 12 (ref 5–15)
BUN: 13 mg/dL (ref 8–23)
CO2: 22 mmol/L (ref 22–32)
Calcium: 9.1 mg/dL (ref 8.9–10.3)
Chloride: 102 mmol/L (ref 98–111)
Creatinine, Ser: 1.32 mg/dL — ABNORMAL HIGH (ref 0.61–1.24)
GFR calc Af Amer: >60 mL/min (ref >60)
GFR calc non Af Amer: 55 mL/min — ABNORMAL LOW (ref >60)
Glucose, Bld: 98 mg/dL (ref 70–99)
Potassium: 4.6 mmol/L (ref 3.5–5.1)
Sodium: 136 mmol/L (ref 135–145)

## 2020-01-09 LAB — HEPATIC FUNCTION PANEL
ALT: 30 U/L (ref 0–44)
AST: 24 U/L (ref 15–41)
Albumin: 3.5 g/dL (ref 3.5–5.0)
Alkaline Phosphatase: 69 U/L (ref 38–126)
Bilirubin, Direct: 0.2 mg/dL (ref 0.0–0.2)
Indirect Bilirubin: 0.5 mg/dL (ref 0.3–0.9)
Total Bilirubin: 0.7 mg/dL (ref 0.3–1.2)
Total Protein: 6.6 g/dL (ref 6.5–8.1)

## 2020-01-09 LAB — LIPID PANEL
Cholesterol: 99 mg/dL (ref 0–200)
HDL: 25 mg/dL — ABNORMAL LOW (ref >40)
LDL Cholesterol: 49 mg/dL (ref 0–99)
Total CHOL/HDL Ratio: 4 RATIO
Triglycerides: 124 mg/dL (ref <150)
VLDL: 25 mg/dL (ref 0–40)

## 2020-01-21 ENCOUNTER — Other Ambulatory Visit: Payer: Self-pay

## 2020-01-21 ENCOUNTER — Ambulatory Visit (HOSPITAL_COMMUNITY)
Admission: RE | Admit: 2020-01-21 | Discharge: 2020-01-21 | Disposition: A | Discharge status: Home / Self Care | Payer: Medicare Other | Source: Ambulatory Visit | Attending: Internal Medicine | Admitting: Internal Medicine

## 2020-01-21 ENCOUNTER — Encounter (HOSPITAL_COMMUNITY): Payer: Self-pay

## 2020-01-21 DIAGNOSIS — I5021 Acute systolic (congestive) heart failure: Secondary | ICD-10-CM | POA: Diagnosis present

## 2020-01-21 DIAGNOSIS — Z79899 Other long term (current) drug therapy: Secondary | ICD-10-CM | POA: Insufficient documentation

## 2020-01-21 DIAGNOSIS — Z7901 Long term (current) use of anticoagulants: Secondary | ICD-10-CM | POA: Insufficient documentation

## 2020-01-21 DIAGNOSIS — I252 Old myocardial infarction: Secondary | ICD-10-CM | POA: Diagnosis not present

## 2020-01-21 DIAGNOSIS — I5022 Chronic systolic (congestive) heart failure: Secondary | ICD-10-CM

## 2020-01-21 DIAGNOSIS — I110 Hypertensive heart disease with heart failure: Secondary | ICD-10-CM | POA: Insufficient documentation

## 2020-01-21 DIAGNOSIS — Z86711 Personal history of pulmonary embolism: Secondary | ICD-10-CM | POA: Insufficient documentation

## 2020-01-21 DIAGNOSIS — I2510 Atherosclerotic heart disease of native coronary artery without angina pectoris: Secondary | ICD-10-CM | POA: Diagnosis not present

## 2020-01-21 DIAGNOSIS — Z86718 Personal history of other venous thrombosis and embolism: Secondary | ICD-10-CM | POA: Insufficient documentation

## 2020-01-21 HISTORY — DX: Chronic systolic (congestive) heart failure: I50.22

## 2020-01-21 MED ORDER — ENTRESTO 49-51 MG PO TABS: 1.0000 | ORAL_TABLET | Freq: Two times a day (BID) | ORAL | 3 refills | Status: DC

## 2020-01-21 NOTE — Patient Instructions (Signed)
It was a pleasure seeing you today!  MEDICATIONS: -We are changing your medications today -Increase your Entresto to 1 tablet (49/51 mg) by mouth twice daily. You can take 2 tablets of the 24/26 mg Entresto tablets twice daily until you run out.  -Call if you have questions about your medications.  NEXT APPOINTMENT: Return to clinic in 1 with Dr. Haroldine Laws.  In general, to take care of your heart failure: -Limit your fluid intake to 2 Liters (half-gallon) per day.   -Limit your salt intake to ideally 2-3 grams (2000-3000 mg) per day. -Weigh yourself daily and record, and bring that "weight diary" to your next appointment.  (Weight gain of 2-3 pounds in 1 day typically means fluid weight.) -The medications for your heart are to help your heart and help you live longer.   -Please contact us before stopping any of your heart medications.  Call the clinic at [phone] with questions or to reschedule future appointments.

## 2020-01-21 NOTE — Progress Notes (Signed)
PCP: System, Pcp Not In PCP-Cardiologist: Dr. Haroldine Laws   HPI:  Mr. 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  Emergent cardiac catheterization showed a proximally occluded LAD treated w/ successful PCI + DES from prox to mid LAD. Also found to have distal occlusion of the RCA with left-to-right collaterals. Ejection fraction was estimated at 35-45% byventriculogram. Post catheterization he developed acute respiratory distress with an EDP of 36 and an IABP was inserted. Peak high-sensitivity troponin was greater than 27,000.  Echocardiogram on 3/12: Showed an EF of 25 to 30%. The RV was normal.3/13, he was weaned off of IABP and remained stable. Had AKI w/ SCr peaking at 1.9 but improved down to 1.4. No recurrent anginal symptomatolgy post PCI. Was placed on DAPT w/ ASA + Plavix (also low dose Eliquis for VTE prophylaxis). He will remain on triple therapy x 30 days, then will stop ASA after 1 month of therapy. High intensity statin was added, atorvastatin 80 (LDL 106 mg/dL). He tolerated low dose ARB,? blockerand spironolactone. Vital signs remained stable but BP too soft for Entesto. Was transferred out of ICU to progressive care unit and remained stable. He progressed w/ cardiac rehab w/o exertional symptoms. Rhythm remained stable on tele. No signs of volume overload. Discharged home on 3/15.   Today he returns to HF clinic for pharmacist medication titration. At last visit with Lyda Jester on 12/31/19, his losartan was switched to Entresto 24/26 mg BID. Overall he feels good. He denies dizziness, lightheadedness, chest pain, or palpitations. He says he gets tired easier than previously. He reports his breathing is good and denies shortness of breath. He is able to complete all ADLs. He weighs himself at home daily and his reported weights  are now around 264 lbs. He is not taking a daily diuretic. No JVD, PND, or orthopnea. He has 1+ pitting edema around his lower left leg, no edema on the right. 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 ReDS reading is within normal limits today at 33%. He says his appetite is normal and good. He takes his medications as prescribed.   . Shortness of breath/dyspnea on exertion? no  . Orthopnea/PND? no . Edema? Yes - 1+ edema in lower left leg . Lightheadedness/dizziness? no . Daily weights at home? yes . Blood pressure/heart rate monitoring at home? no . Following low-sodium/fluid-restricted diet? yes  HF Medications: Carvedilol 3.125 mg BID Entresto 24/26 mg BID Spironolactone 12.5 mg daily Digoxin 0.125 mg daily  Has the patient been experiencing any side effects to the medications prescribed?  no  Does the patient have any problems obtaining medications due to transportation or finances?   Yes - concerned about medication access when he is in the donut hole - I have added him to [employer] foundation wait list.   Understanding of regimen: good Understanding of indications: good Potential of compliance: good Patient understands to avoid NSAIDs. Patient understands to avoid decongestants.    Pertinent Lab Values (01/09/20): Marland Kitchen Serum creatinine 1.32, BUN 13, Potassium 4.6, Sodium 136, Digoxin 0.5 ng/mL  Vital Signs: . Weight: 266 lb (last clinic weight: 275 lbs) . Blood pressure: 124/76 mmHg . Heart rate: 52 bpm  Assessment: 1. CAD: - s/p anterior MI 12/14/19. Hs troponin > 27,000. Cath with proximal occlusion of the LAD and distal RCA occlusion with left-to-right collaterals (thought to  be chronic). Status post emergent PCI of the LAD. - no recurrent angina  - S/p triple therapy w/ aspirin + Plavix + Eliquis (Has been on low-dose Eliquis for remote history of DVT/PE).  His aspirin was stopped on 01/14/20. He confirmed he was no longer taking aspirin.   - continuecarvedilol3.125 mg BID. HR limits further titration - on high dose statin therapy, atorvastatin 80 mg qhs. Recent LDL 49 mg/dL on 01/09/20.  -Refer to outpatient cardiac rehab   2. 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  - Echo 12/14/19 EF of 25 to 30%. - Minimally volume overload. NYHA II - Labs: SCr 1.32, K 4.6 - Vitals: BP 124/76, HR 52 - Continue carvedilol 3.125 mg BID - HR limits further titration. - Increase Entresto to 49/51 mg BID. - Continue spironolactone 12.5 mg daily - Continue digoxin 0.125 mg daily (last level 0.5 ng/mL on 12/31/19) - Consider adding an SGLT2i during a future visit. Pharmacy Benefits Investigation: Farxiga $47.00/month, Jardiance $47.00/month.  - Discussed adapting to a low potassium diet. He has been eating several bananas each week and this could limit titration of HF medications that can also raise potassium. - Will need gradual titration of HF regimen and repeat echo after 3 months of maximum GDMT.If EF remains < 35%, refer to EP for ICD consideration  3. Remote history of DVT/PE -Anticoagulation as above. -On reduced prophylaxis Eliquis dose of 2.5 mg bid    Plan: 1) Medication changes: Based on clinical presentation, vital signs and recent labs will increase Entresto to 49/51 mg BID 2) Labs: BMET during next visit 3) Follow-up: 02/18/20 with Dr. Haroldine Laws   Vertis Kelch, PharmD, BCPS Heart Failure Clinic Pharmacist [phone]  Audry Riles, PharmD, BCPS, BCCP, CPP Heart Failure Clinic Pharmacist [phone]

## 2020-02-18 ENCOUNTER — Other Ambulatory Visit: Payer: Self-pay

## 2020-02-18 ENCOUNTER — Encounter (HOSPITAL_COMMUNITY): Payer: Self-pay | Admitting: Internal Medicine

## 2020-02-18 ENCOUNTER — Ambulatory Visit (HOSPITAL_COMMUNITY)
Admission: RE | Admit: 2020-02-18 | Discharge: 2020-02-18 | Disposition: A | Discharge status: Home / Self Care | Payer: Medicare Other | Source: Ambulatory Visit | Attending: Internal Medicine | Admitting: Internal Medicine

## 2020-02-18 VITALS — BP 126/84 | HR 50 | Wt 260.8 lb

## 2020-02-18 DIAGNOSIS — I5021 Acute systolic (congestive) heart failure: Secondary | ICD-10-CM | POA: Diagnosis present

## 2020-02-18 DIAGNOSIS — Z955 Presence of coronary angioplasty implant and graft: Secondary | ICD-10-CM | POA: Insufficient documentation

## 2020-02-18 DIAGNOSIS — Z86711 Personal history of pulmonary embolism: Secondary | ICD-10-CM | POA: Insufficient documentation

## 2020-02-18 DIAGNOSIS — I5022 Chronic systolic (congestive) heart failure: Secondary | ICD-10-CM | POA: Diagnosis not present

## 2020-02-18 DIAGNOSIS — I252 Old myocardial infarction: Secondary | ICD-10-CM | POA: Insufficient documentation

## 2020-02-18 DIAGNOSIS — Z7901 Long term (current) use of anticoagulants: Secondary | ICD-10-CM | POA: Diagnosis not present

## 2020-02-18 DIAGNOSIS — I2510 Atherosclerotic heart disease of native coronary artery without angina pectoris: Secondary | ICD-10-CM | POA: Diagnosis not present

## 2020-02-18 DIAGNOSIS — I110 Hypertensive heart disease with heart failure: Secondary | ICD-10-CM | POA: Insufficient documentation

## 2020-02-18 DIAGNOSIS — Z79899 Other long term (current) drug therapy: Secondary | ICD-10-CM | POA: Insufficient documentation

## 2020-02-18 DIAGNOSIS — Z86718 Personal history of other venous thrombosis and embolism: Secondary | ICD-10-CM | POA: Diagnosis not present

## 2020-02-18 DIAGNOSIS — Z7902 Long term (current) use of antithrombotics/antiplatelets: Secondary | ICD-10-CM | POA: Insufficient documentation

## 2020-02-18 DIAGNOSIS — R001 Bradycardia, unspecified: Secondary | ICD-10-CM | POA: Insufficient documentation

## 2020-02-18 MED ORDER — ENTRESTO 97-103 MG PO TABS
1.0000 | ORAL_TABLET | Freq: Two times a day (BID) | ORAL | 6 refills | Status: DC
Start: 2020-02-18 — End: 2020-09-24

## 2020-02-18 NOTE — Patient Instructions (Signed)
Stop Digoxin  Increase Entresto to 97/103 mg Twice daily   Your physician recommends that you schedule a follow-up appointment in: 2 months with echocardiogram  If you have any questions or concerns before your next appointment please send [place] a message through [place] or call our office at [phone].  At the [facility], you and your health needs are our priority. As part of our continuing mission to provide you with exceptional heart care, we have created designated Provider Care Teams. These Care Teams include your primary Cardiologist (physician) and Advanced Practice Providers (APPs- Physician Assistants and Nurse Practitioners) who all work together to provide you with the care you need, when you need it.   You may see any of the following providers on your designated Care Team at your next follow up: Marland Kitchen Dr Glori Bickers . Dr Loralie Champagne . Darrick Grinder, NP . Lyda Jester, PA . Audry Riles, PharmD   Please be sure to bring in all your medications bottles to every appointment.

## 2020-02-18 NOTE — Progress Notes (Signed)
Advanced Heart Failure Clinic Note   Referring Physician: PCP: System, Pcp Not In PCP-Cardiologist: Dr. 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  Emergent cardiac catheterization showed a proximally occluded LAD treated w/ successful PCI + DES from prox to mid LAD. Also found to have distal occlusion of the RCA with left-to-right collaterals. Ejection fraction was estimated at 35-45% by ventriculogram. Post catheterization he developed acute respiratory distress with an EDP of 36 and an IABP was inserted. Peak high-sensitivity troponin was greater than 27,000.  Echocardiogram on 3/12: Showed an EF of 25 to 30%. The RV was normal. 3/13, he was weaned off of IABP and remained stable. Had AKI w/ SCr peaking at 1.9 but improved down to 1.4. No recurrent anginal symptomatolgy post PCI. Was placed on DAPT w/ ASA + Plavix (also low dose Eliquis for VTE prophylaxis). He will remain on triple therapy x 30 days, then will stop ASA after 1 month of therapy. High intensity statin was added, atorvastatin 80 (LDL 106 mg/dL). He tolerated low dose ARB, ? blocker and spironolactone. Vital signs remained stable but BP too soft for Entesto.  Was transferred out of ICU to progressive care unit and remained stable. He progressed w/ cardiac rehab w/o exertional symptoms. Rhythm remained stable on tele. No signs of volume overload. Discharged home on 3/15.   He presents for routine f/u. Feels good. Does all activities without problem. Walking 30 mins on TM everyday. No edema, CP, SOB, orthopnea or PND. Compliant with meds. Has been following with HF PharmD and meds adjusted.    Past Medical History:  Diagnosis Date  . DVT (deep venous thrombosis) ([place])   . Hypertension   . Pneumonia   . Pulmonary emboli [facility])     Current Outpatient  Medications  Medication Sig Dispense Refill  . atorvastatin (LIPITOR) 80 MG tablet Take 1 tablet (80 mg total) by mouth daily at 6 PM. 30 tablet 5  . carvedilol (COREG) 3.125 MG tablet Take 1 tablet (3.125 mg total) by mouth 2 (two) times daily with a meal. 60 tablet 5  . Cholecalciferol (VITAMIN D3) 125 MCG (5000 UT) CAPS Take 5,000 Units by mouth daily with breakfast.    . clobetasol ointment (TEMOVATE) 7.32 % Apply 1 application topically 2 (two) times daily.    . clopidogrel (PLAVIX) 75 MG tablet Take 1 tablet (75 mg total) by mouth daily with breakfast. 30 tablet 11  . digoxin (LANOXIN) 0.125 MG tablet Take 1 tablet (0.125 mg total) by mouth daily. 30 tablet 5  . ELIQUIS 2.5 MG TABS tablet Take 1 tablet (2.5 mg total) by mouth 2 (two) times daily. 60 tablet 5  . nitroGLYCERIN (NITROSTAT) 0.4 MG SL tablet Place 1 tablet (0.4 mg total) under the tongue every 5 (five) minutes x 3 doses as needed for chest pain. 25 tablet 3  . sacubitril-valsartan (ENTRESTO) 49-51 MG Take 1 tablet by mouth 2 (two) times daily. 60 tablet 3  . spironolactone (ALDACTONE) 25 MG tablet Take 0.5 tablets (12.5 mg total) by mouth daily. 30 tablet 5   No current facility-administered medications for this encounter.    No Known Allergies    Social History   Socioeconomic History  . Marital status: Single    Spouse name: Not on file  . Number of children: Not on file  . Years of  education: Not on file  . Highest education level: Not on file  Occupational History  . Not on file  Tobacco Use  . Smoking status: Never Smoker  . Smokeless tobacco: Current User    Types: Chew  Substance and Sexual Activity  . Alcohol use: Yes    Comment: daily  . Drug use: Never  . Sexual activity: Not on file  Other Topics Concern  . Not on file  Social History Narrative  . Not on file   Social Determinants of Health   Financial Resource Strain:   . Difficulty of Paying Living Expenses:   Food Insecurity:   . Worried  About Charity fundraiser in the Last Year:   . Arboriculturist in the Last Year:   Transportation Needs:   . Film/video editor (Medical):   Marland Kitchen Lack of Transportation (Non-Medical):   Physical Activity:   . Days of Exercise per Week:   . Minutes of Exercise per Session:   Stress:   . Feeling of Stress :   Social Connections:   . Frequency of Communication with Friends and Family:   . Frequency of Social Gatherings with Friends and Family:   . Attends Religious Services:   . Active Member of Clubs or Organizations:   . Attends Archivist Meetings:   Marland Kitchen Marital Status:   Intimate Partner Violence:   . Fear of Current or Ex-Partner:   . Emotionally Abused:   Marland Kitchen Physically Abused:   . Sexually Abused:      No family history on file.  Vitals:   02/18/20 1433  BP: 126/84  Pulse: (!) 50  SpO2: 97%  Weight: 118.3 kg (260 lb 12.8 oz)     PHYSICAL EXAM: General:  Well appearing. No resp difficulty HEENT: normal Neck: supple. no JVD. Carotids 2+ bilat; no bruits. No lymphadenopathy or thryomegaly appreciated. Cor: PMI nondisplaced. Regular brady. No rubs, gallops or murmurs. Lungs: clear Abdomen: obese soft, nontender, nondistended. No hepatosplenomegaly. No bruits or masses. Good bowel sounds. Extremities: no cyanosis, clubbing, rash, edema Neuro: alert & orientedx3, cranial nerves grossly intact. moves all 4 extremities w/o difficulty. Affect pleasant   ECG: sinus bradycardia, 51 bpm Personally reviewed   ASSESSMENT & PLAN:  1. CAD: - s/p anterior MI 12/14/19. Hs troponin > 27,000. Cath with proximal occlusion of the LAD and distal RCA occlusion with left-to-right collaterals (thought to be chronic). Status post emergent PCI of the LAD. - no recurrent angina  - Continue Plavix + Eliquis - continue ? blocker therapy w/ carvedilol 3.125 mg bid. HR limits further titration (52 bpm)  - continue spironolactone 12.5 mg qd  - last lipids TC 99 HDL 25 TG 124 LDL  49. Continue atorva 80. Continue exercise to increase HDL.  - recent bloodwork ok   2. Acute systolic HF due to iCM - EF estimated at 35-45% ventriculogram.  - Echo 12/14/19 EF of 25 to 30%. - Volume status stable. NYHA II - Stop digoxin  - Continue spironolactone 12.5 mg  - Increase to Entresto 97/103 bid  - Continue Coreg 3.125 mg bid. HR limits further titration  - Consider future addition of SGLT2 inhibitor. - Return in 6-8 weeks with echo    3. Remote history of DVT/PE - Continue Eliquis 2.5 bid   Glori Bickers, MD 02/18/20

## 2020-02-18 NOTE — Addendum Note (Signed)
Encounter addended by: Scarlette Calico, RN on: 02/18/2020 3:11 PM  Actions taken: Medication long-term status modified, Clinical Note Signed, Order list changed, Diagnosis association updated

## 2020-02-25 ENCOUNTER — Telehealth (HOSPITAL_COMMUNITY): Payer: Self-pay | Admitting: Pharmacist

## 2020-02-25 NOTE — Telephone Encounter (Signed)
Submitted application for PAN heart failure fund grant. Application pending.   Barrett Goldie, PharmD, BCPS, BCCP, CPP Heart Failure Clinic Pharmacist [phone]  

## 2020-03-04 ENCOUNTER — Telehealth (HOSPITAL_COMMUNITY): Payer: Self-pay | Admitting: Pharmacy Technician

## 2020-03-04 NOTE — Telephone Encounter (Signed)
Was successful in securing patient an $1000.00 grant from [employer] [facility]) to provide copayment coverage for [place].  This will keep the out of pocket expense at $0.     I have spoken with the patient.    The billing information is as follows and has been shared with [facility].   Member ID: [id number] Group ID: [id number] RxBin: [id number] Dates of Eligibility: 11/29/19 through 02/25/21  Fund:  [place], CPhT

## 2020-04-23 ENCOUNTER — Other Ambulatory Visit: Payer: Self-pay

## 2020-04-23 ENCOUNTER — Encounter (HOSPITAL_COMMUNITY): Payer: Self-pay | Admitting: Internal Medicine

## 2020-04-23 ENCOUNTER — Ambulatory Visit (HOSPITAL_COMMUNITY)
Admission: RE | Admit: 2020-04-23 | Discharge: 2020-04-23 | Disposition: A | Discharge status: Home / Self Care | Payer: Medicare Other | Source: Ambulatory Visit | Attending: Internal Medicine | Admitting: Internal Medicine

## 2020-04-23 ENCOUNTER — Ambulatory Visit (HOSPITAL_BASED_OUTPATIENT_CLINIC_OR_DEPARTMENT_OTHER)
Admission: RE | Admit: 2020-04-23 | Discharge: 2020-04-23 | Disposition: A | Discharge status: Home / Self Care | Payer: Medicare Other | Source: Ambulatory Visit | Attending: Internal Medicine | Admitting: Internal Medicine

## 2020-04-23 VITALS — BP 94/58 | HR 44 | Wt 254.0 lb

## 2020-04-23 DIAGNOSIS — Z7901 Long term (current) use of anticoagulants: Secondary | ICD-10-CM | POA: Diagnosis not present

## 2020-04-23 DIAGNOSIS — Z955 Presence of coronary angioplasty implant and graft: Secondary | ICD-10-CM | POA: Insufficient documentation

## 2020-04-23 DIAGNOSIS — I2510 Atherosclerotic heart disease of native coronary artery without angina pectoris: Secondary | ICD-10-CM | POA: Insufficient documentation

## 2020-04-23 DIAGNOSIS — I252 Old myocardial infarction: Secondary | ICD-10-CM | POA: Insufficient documentation

## 2020-04-23 DIAGNOSIS — Z86711 Personal history of pulmonary embolism: Secondary | ICD-10-CM | POA: Insufficient documentation

## 2020-04-23 DIAGNOSIS — Z79899 Other long term (current) drug therapy: Secondary | ICD-10-CM | POA: Insufficient documentation

## 2020-04-23 DIAGNOSIS — Z86718 Personal history of other venous thrombosis and embolism: Secondary | ICD-10-CM | POA: Diagnosis not present

## 2020-04-23 DIAGNOSIS — I110 Hypertensive heart disease with heart failure: Secondary | ICD-10-CM | POA: Insufficient documentation

## 2020-04-23 DIAGNOSIS — I5021 Acute systolic (congestive) heart failure: Secondary | ICD-10-CM | POA: Insufficient documentation

## 2020-04-23 DIAGNOSIS — R001 Bradycardia, unspecified: Secondary | ICD-10-CM | POA: Diagnosis not present

## 2020-04-23 DIAGNOSIS — I5022 Chronic systolic (congestive) heart failure: Secondary | ICD-10-CM | POA: Diagnosis not present

## 2020-04-23 DIAGNOSIS — Z7902 Long term (current) use of antithrombotics/antiplatelets: Secondary | ICD-10-CM | POA: Insufficient documentation

## 2020-04-23 LAB — CBC
HCT: 44.5 % (ref 39.0–52.0)
Hemoglobin: 14.9 g/dL (ref 13.0–17.0)
MCH: 33.7 pg (ref 26.0–34.0)
MCHC: 33.5 g/dL (ref 30.0–36.0)
MCV: 100.7 fL — ABNORMAL HIGH (ref 80.0–100.0)
Platelets: 205 10*3/uL (ref 150–400)
RBC: 4.42 MIL/uL (ref 4.22–5.81)
RDW: 13.6 % (ref 11.5–15.5)
WBC: 3.3 10*3/uL — ABNORMAL LOW (ref 4.0–10.5)
nRBC: 0 % (ref 0.0–0.2)

## 2020-04-23 LAB — BASIC METABOLIC PANEL
Anion gap: 9 (ref 5–15)
BUN: 18 mg/dL (ref 8–23)
CO2: 25 mmol/L (ref 22–32)
Calcium: 8.6 mg/dL — ABNORMAL LOW (ref 8.9–10.3)
Chloride: 104 mmol/L (ref 98–111)
Creatinine, Ser: 1.4 mg/dL — ABNORMAL HIGH (ref 0.61–1.24)
GFR calc Af Amer: 60 mL/min — ABNORMAL LOW (ref >60)
GFR calc non Af Amer: 52 mL/min — ABNORMAL LOW (ref >60)
Glucose, Bld: 95 mg/dL (ref 70–99)
Potassium: 4.3 mmol/L (ref 3.5–5.1)
Sodium: 138 mmol/L (ref 135–145)

## 2020-04-23 LAB — ECHOCARDIOGRAM COMPLETE
Area-P 1/2: 3.06 cm2
Calc EF: 51.6 %
S' Lateral: 2.8 cm
Single Plane A2C EF: 50.9 %
Single Plane A4C EF: 52 %

## 2020-04-23 LAB — TSH: TSH: 2.994 u[IU]/mL (ref 0.350–4.500)

## 2020-04-23 NOTE — Progress Notes (Signed)
  Echocardiogram 2D Echocardiogram has been performed.  Thomas Carpenter 04/23/2020, 3:08 PM

## 2020-04-23 NOTE — Patient Instructions (Signed)
STOP Carvedilol   Labs today We will only contact you if something comes back abnormal or we need to make some changes. Otherwise no news is good news!  You have been referred to [facility] Address: [address], [place], [place] [address] Phone: [phone] - they will be in contact with you for an appointment  Congratulations on your graduation from the HF clinic, you may follow up with [place] as needed.

## 2020-04-23 NOTE — Progress Notes (Signed)
Advanced Heart Failure Clinic Note   Referring Physician: PCP: Default, Provider, MD PCP-Cardiologist: Dr. Lorrin Mais   HPI:  Mr. 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  Emergent cardiac catheterization showed a proximally occluded LAD treated w/ successful PCI + DES from prox to mid LAD. Also found to have distal occlusion of the RCA with left-to-right collaterals. Ejection fraction was estimated at 35-45% by ventriculogram. Post catheterization he developed acute respiratory distress with an EDP of 36 and an IABP was inserted. Peak high-sensitivity troponin was greater than 27,000.  Echocardiogram on 3/12: Showed an EF of 25 to 30%. The RV was normal. 3/13, he was weaned off of IABP and remained stable. Had AKI w/ SCr peaking at 1.9 but improved down to 1.4. No recurrent anginal symptomatolgy post PCI. Was placed on DAPT w/ ASA + Plavix (also low dose Eliquis for VTE prophylaxis). He will remain on triple therapy x 30 days, then will stop ASA after 1 month of therapy. High intensity statin was added, atorvastatin 80 (LDL 106 mg/dL). He tolerated low dose ARB, ? blocker and spironolactone. Vital signs remained stable but BP too soft for Entesto.  Was transferred out of ICU to progressive care unit and remained stable. He progressed w/ cardiac rehab w/o exertional symptoms. Rhythm remained stable on tele. No signs of volume overload. Discharged home on 3/15.   He presents for routine f/u. Feels good. Remains active. Walks on TM for 30 mins 5 days/week. No CP or SOB. No edema. No problems with meds. Only snores when he sleeps on his back.   Echo today: EF 50-55% anteroseptal HK Personally reviewed     Past Medical History:  Diagnosis Date  . DVT (deep venous thrombosis) ([place])   . Hypertension   . Pneumonia   . Pulmonary emboli [facility])       Current Outpatient Medications  Medication Sig Dispense Refill  . atorvastatin (LIPITOR) 80 MG tablet Take 1 tablet (80 mg total) by mouth daily at 6 PM. 30 tablet 5  . carvedilol (COREG) 3.125 MG tablet Take 1 tablet (3.125 mg total) by mouth 2 (two) times daily with a meal. 60 tablet 5  . Cholecalciferol (VITAMIN D3) 125 MCG (5000 UT) CAPS Take 5,000 Units by mouth daily with breakfast.    . clobetasol ointment (TEMOVATE) 6.83 % Apply 1 application topically 2 (two) times daily.    . clopidogrel (PLAVIX) 75 MG tablet Take 1 tablet (75 mg total) by mouth daily with breakfast. 30 tablet 11  . ELIQUIS 2.5 MG TABS tablet Take 1 tablet (2.5 mg total) by mouth 2 (two) times daily. 60 tablet 5  . nitroGLYCERIN (NITROSTAT) 0.4 MG SL tablet Place 1 tablet (0.4 mg total) under the tongue every 5 (five) minutes x 3 doses as needed for chest pain. 25 tablet 3  . sacubitril-valsartan (ENTRESTO) 97-103 MG Take 1 tablet by mouth 2 (two) times daily. 60 tablet 6  . spironolactone (ALDACTONE) 25 MG tablet Take 0.5 tablets (12.5 mg total) by mouth daily. 30 tablet 5   No current facility-administered medications for this encounter.    No Known Allergies    Social History   Socioeconomic History  . Marital status: Single    Spouse name: Not on file  . Number of children: Not on file  . Years of education: Not on file  . Highest education level: Not  on file  Occupational History  . Not on file  Tobacco Use  . Smoking status: Never Smoker  . Smokeless tobacco: Current User    Types: Chew  Vaping Use  . Vaping Use: Never used  Substance and Sexual Activity  . Alcohol use: Yes    Comment: daily  . Drug use: Never  . Sexual activity: Not on file  Other Topics Concern  . Not on file  Social History Narrative  . Not on file   Social Determinants of Health   Financial Resource Strain:   . Difficulty of Paying Living Expenses:   Food Insecurity:   . Worried About Charity fundraiser in  the Last Year:   . Arboriculturist in the Last Year:   Transportation Needs:   . Film/video editor (Medical):   Marland Kitchen Lack of Transportation (Non-Medical):   Physical Activity:   . Days of Exercise per Week:   . Minutes of Exercise per Session:   Stress:   . Feeling of Stress :   Social Connections:   . Frequency of Communication with Friends and Family:   . Frequency of Social Gatherings with Friends and Family:   . Attends Religious Services:   . Active Member of Clubs or Organizations:   . Attends Archivist Meetings:   Marland Kitchen Marital Status:   Intimate Partner Violence:   . Fear of Current or Ex-Partner:   . Emotionally Abused:   Marland Kitchen Physically Abused:   . Sexually Abused:      No family history on file.  Vitals:   04/23/20 1504  BP: (!) 94/58  Pulse: (!) 44  SpO2: 98%  Weight: 115.2 kg (254 lb)     PHYSICAL EXAM: General:  Well appearing. No resp difficulty HEENT: normal Neck: supple. no JVD. Carotids 2+ bilat; no bruits. No lymphadenopathy or thryomegaly appreciated. Cor: PMI nondisplaced. Loletha Grayer regular . No rubs, gallops or murmurs. Lungs: clear Abdomen: obese soft, nontender, nondistended. No hepatosplenomegaly. No bruits or masses. Good bowel sounds. Extremities: no cyanosis, clubbing, rash, edema Neuro: alert & orientedx3, cranial nerves grossly intact. moves all 4 extremities w/o difficulty. Affect pleasant   ECG: sinus bradycardia, 44 bpm low volts Personally reviewed   ASSESSMENT & PLAN:  1. CAD: - s/p anterior MI 12/14/19. Hs troponin > 27,000. Cath with proximal occlusion of the LAD and distal RCA occlusion with left-to-right collaterals (thought to be chronic). Status post emergent PCI of the LAD. - Continue Plavix + Eliquis. Off ASA - continue spironolactone 12.5 mg qd  - stop b-blocker with marked bradycardia - last lipids TC 99 HDL 25 TG 124 LDL 49. Continue atorva 80. Continue exercise to increase HDL.   2. Acute systolic HF due to  iCM - EF estimated at 35-45% ventriculogram.  - Echo 12/14/19 EF of 25 to 30%. - Echo today (04/23/20) EF 50-55%  - Volume status stable. NYHA II - Continue spironolactone 12.5 mg  - Continue Entresto 97/103 bid  - Stop carvedilol with HR 44  3. Remote history of DVT/PE - Continue Eliquis 2.5 bid  4. Marked sinus bradycardia - stop b-blocker.  - check labs  Doing well. EF low-normal. Can graduate HF Clinic. F/u [facility].   Glori Bickers, MD 04/23/20

## 2020-06-15 ENCOUNTER — Other Ambulatory Visit: Payer: Self-pay | Admitting: Cardiology

## 2020-06-17 ENCOUNTER — Ambulatory Visit: Payer: BLUE CROSS/BLUE SHIELD | Admitting: Cardiovascular Disease

## 2020-06-25 ENCOUNTER — Other Ambulatory Visit: Payer: Self-pay

## 2020-06-25 ENCOUNTER — Ambulatory Visit (INDEPENDENT_AMBULATORY_CARE_PROVIDER_SITE_OTHER): Payer: Medicare Other | Admitting: Cardiology

## 2020-06-25 ENCOUNTER — Encounter: Payer: Self-pay | Admitting: Cardiology

## 2020-06-25 VITALS — BP 118/78 | HR 54 | Ht 69.0 in | Wt 244.0 lb

## 2020-06-25 DIAGNOSIS — E875 Hyperkalemia: Secondary | ICD-10-CM

## 2020-06-25 DIAGNOSIS — E782 Mixed hyperlipidemia: Secondary | ICD-10-CM

## 2020-06-25 DIAGNOSIS — E669 Obesity, unspecified: Secondary | ICD-10-CM

## 2020-06-25 DIAGNOSIS — R5383 Other fatigue: Secondary | ICD-10-CM

## 2020-06-25 DIAGNOSIS — Z955 Presence of coronary angioplasty implant and graft: Secondary | ICD-10-CM

## 2020-06-25 DIAGNOSIS — Z86711 Personal history of pulmonary embolism: Secondary | ICD-10-CM

## 2020-06-25 DIAGNOSIS — I2510 Atherosclerotic heart disease of native coronary artery without angina pectoris: Secondary | ICD-10-CM

## 2020-06-25 DIAGNOSIS — E559 Vitamin D deficiency, unspecified: Secondary | ICD-10-CM

## 2020-06-25 HISTORY — DX: Personal history of pulmonary embolism: Z86.711

## 2020-06-25 HISTORY — DX: Mixed hyperlipidemia: E78.2

## 2020-06-25 HISTORY — DX: Atherosclerotic heart disease of native coronary artery without angina pectoris: I25.10

## 2020-06-25 HISTORY — DX: Obesity, unspecified: E66.9

## 2020-06-25 NOTE — Progress Notes (Signed)
Cardiology Office Note:    Date:  06/25/2020   ID:  Thomas Carpenter, DOB 01-29-1951, MRN [id number]  PCP:  Default, Provider, MD  Cardiologist:  Jenean Lindau, MD   Referring MD: Jolaine Artist, MD    ASSESSMENT:    1. Status post coronary artery stent placement   2. Coronary artery disease involving native coronary artery of native heart without angina pectoris   3. Mixed dyslipidemia   4. History of pulmonary embolism   5. Vitamin D deficiency   6. Fatigue, unspecified type   7. Obesity (BMI 35.0-39.9 without comorbidity)    PLAN:    In order of problems listed above:  1. Coronary artery disease: Secondary prevention stressed with the patient.  Importance of compliance with diet medication stressed any vocalized understanding. 2. Essential hypertension: Blood pressure stable and diet was emphasized 3. Mixed dyslipidemia: Diet was emphasized he will back in the next few days for blood work including fasting lipids 4. Obesity: Diet was emphasized.  He mentions to me that he has lost 30 pounds in the last few months.  He is planning to do more with this.  I congratulated him and told him to pursue this meticulously. 5. Patient will be seen in follow-up appointment in 3 months or earlier if the patient has any concerns    Medication Adjustments/Labs and Tests Ordered: Current medicines are reviewed at length with the patient today.  Concerns regarding medicines are outlined above.  Orders Placed This Encounter  Procedures  . Basic metabolic panel  . CBC with Differential/Platelet  . Hepatic function panel  . Lipid panel  . TSH  . VITAMIN D 25 Hydroxy (Vit-D Deficiency, Fractures)  . EKG 12-Lead   No orders of the defined types were placed in this encounter.    No chief complaint on file.    History of Present Illness:    Thomas Carpenter is a 69 y.o. male.  Patient has past medical history of coronary artery disease post myocardial infarction.  Details of  coronary angiography and stenting are mentioned below.  He subsequently has had no problems.  No chest pain orthopnea or PND.  Echocardiogram done 2 months ago was unremarkable.  Ejection fraction was normal.  His ejection fraction was significantly depressed at the time of the coronary event.  He has history of mixed dyslipidemia and pulmonary embolism.  At the time of my evaluation, the patient is alert awake oriented and in no distress.  He exercises on a regular basis without any symptoms.  Past Medical History:  Diagnosis Date  . DVT (deep venous thrombosis) ([place])   . Hypertension   . Pneumonia   . Pulmonary emboli [facility])     Past Surgical History:  Procedure Laterality Date  . CORONARY STENT INTERVENTION N/A 12/13/2019   Procedure: CORONARY STENT INTERVENTION;  Surgeon: Belva Crome, MD;  Location: [place] [facility];  Service: Cardiovascular;  Laterality: N/A;  . CORONARY/GRAFT ACUTE MI REVASCULARIZATION N/A 12/13/2019   Procedure: Coronary/Graft Acute MI Revascularization;  Surgeon: Belva Crome, MD;  Location: [place] [facility];  Service: Cardiovascular;  Laterality: N/A;  . IABP INSERTION N/A 12/13/2019   Procedure: IABP Insertion;  Surgeon: Belva Crome, MD;  Location: [place] [facility];  Service: Cardiovascular;  Laterality: N/A;  . LEFT HEART CATH AND CORONARY ANGIOGRAPHY N/A 12/13/2019   Procedure: LEFT HEART CATH AND CORONARY ANGIOGRAPHY;  Surgeon: Belva Crome, MD;  Location: [place] [facility];  Service: Cardiovascular;  Laterality: N/A;    Current Medications: Current Meds  Medication Sig  . atorvastatin (LIPITOR) 80 MG tablet TAKE 1 TABLET (80 MG TOTAL) BY MOUTH DAILY AT 6 PM.  . Cholecalciferol (VITAMIN D3) 125 MCG (5000 UT) CAPS Take 5,000 Units by mouth daily with breakfast.  . clobetasol ointment (TEMOVATE) 4.25 % Apply 1 application topically 2 (two) times daily.  . clopidogrel (PLAVIX) 75 MG tablet Take 1 tablet (75 mg total) by mouth daily with breakfast.    . ELIQUIS 2.5 MG TABS tablet Take 1 tablet (2.5 mg total) by mouth 2 (two) times daily.  . nitroGLYCERIN (NITROSTAT) 0.4 MG SL tablet Place 1 tablet (0.4 mg total) under the tongue every 5 (five) minutes x 3 doses as needed for chest pain.  . sacubitril-valsartan (ENTRESTO) 97-103 MG Take 1 tablet by mouth 2 (two) times daily.  Marland Kitchen spironolactone (ALDACTONE) 25 MG tablet Take 0.5 tablets (12.5 mg total) by mouth daily.     Allergies:   Patient has no known allergies.   Social History   Socioeconomic History  . Marital status: Single    Spouse name: Not on file  . Number of children: Not on file  . Years of education: Not on file  . Highest education level: Not on file  Occupational History  . Not on file  Tobacco Use  . Smoking status: Never Smoker  . Smokeless tobacco: Current User    Types: Chew  Vaping Use  . Vaping Use: Never used  Substance and Sexual Activity  . Alcohol use: Yes    Comment: daily  . Drug use: Never  . Sexual activity: Not on file  Other Topics Concern  . Not on file  Social History Narrative  . Not on file   Social Determinants of Health   Financial Resource Strain:   . Difficulty of Paying Living Expenses: Not on file  Food Insecurity:   . Worried About Charity fundraiser in the Last Year: Not on file  . Ran Out of Food in the Last Year: Not on file  Transportation Needs:   . Lack of Transportation (Medical): Not on file  . Lack of Transportation (Non-Medical): Not on file  Physical Activity:   . Days of Exercise per Week: Not on file  . Minutes of Exercise per Session: Not on file  Stress:   . Feeling of Stress : Not on file  Social Connections:   . Frequency of Communication with Friends and Family: Not on file  . Frequency of Social Gatherings with Friends and Family: Not on file  . Attends Religious Services: Not on file  . Active Member of Clubs or Organizations: Not on file  . Attends Archivist Meetings: Not on file  .  Marital Status: Not on file     Family History: The patient's Family history is unknown by patient.  ROS:   Please see the history of present illness.    All other systems reviewed and are negative.  EKGs/Labs/Other Studies Reviewed:    The following studies were reviewed today: IMPRESSIONS    1. Left ventricular ejection fraction, by estimation, is 50 to 55%. The  left ventricle has low normal function. The left ventricle demonstrates  regional wall motion abnormalities (see scoring diagram/findings for  description). Left ventricular diastolic  parameters are consistent with Grade II diastolic dysfunction  (pseudonormalization). There is hypokinesis of the left ventricular,  basal-mid anteroseptal wall.  2. Right ventricular systolic function is normal. The  right ventricular  size is normal.  3. Left atrial size was mildly dilated.  4. The mitral valve is normal in structure. Mild mitral valve  regurgitation.  5. The aortic valve is tricuspid. Aortic valve regurgitation is trivial.  6. The inferior vena cava is normal in size with greater than 50%  respiratory variability, suggesting right atrial pressure of 3 mmHg.   Belva Crome, MD (Primary)    Procedures  Coronary/Graft Acute MI Revascularization  CORONARY STENT INTERVENTION  IABP Insertion  LEFT HEART CATH AND CORONARY ANGIOGRAPHY  Conclusion    A stent was successfully placed.  A stent was successfully placed.    Anteroseptal myocardial infarction 12 occlusion of the proximal LAD.  Successful PCI with overlapping stent from ostial LAD to mid vessel using 3.0, 26 and 34 mm long Onyx stents deployed at 14 atm.  0% with initial TIMI grade II flow that improved to TIMI grade III after intracoronary nitroglycerin.  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  Widely patent left main.  Widely patent ramus and circumflex.  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  Estimated ejection fraction 40%.  Intra-aortic balloon pump placed post stenting because of developing pulmonary edema and high left ventricular filling pressures.     Recent Labs: 01/09/2020: ALT 30 04/23/2020: BUN 18; Creatinine, Ser 1.40; Hemoglobin 14.9; Platelets 205; Potassium 4.3; Sodium 138; TSH 2.994  Recent Lipid Panel    Component Value Date/Time   CHOL 99 01/09/2020 1416   TRIG 124 01/09/2020 1416   HDL 25 (L) 01/09/2020 1416   CHOLHDL 4.0 01/09/2020 1416   VLDL 25 01/09/2020 1416   LDLCALC 49 01/09/2020 1416    Physical Exam:    VS:  BP 118/78   Pulse (!) 54   Ht 5' 9"  (1.753 m)   Wt 244 lb 0.6 oz (110.7 kg)   SpO2 98%   BMI 36.04 kg/m     Wt Readings from Last 3 Encounters:  06/25/20 244 lb 0.6 oz (110.7 kg)  04/23/20 254 lb (115.2 kg)  02/18/20 260 lb 12.8 oz (118.3 kg)     GEN: Patient is in no acute distress HEENT: Normal NECK: No JVD; No carotid bruits LYMPHATICS: No lymphadenopathy CARDIAC: Hear sounds regular, 2/6 systolic murmur at the apex. RESPIRATORY:  Clear to auscultation without rales, wheezing or rhonchi  ABDOMEN: Soft, non-tender, non-distended MUSCULOSKELETAL:  No edema; No deformity  SKIN: Warm and dry NEUROLOGIC:  Alert and oriented x 3 PSYCHIATRIC:  Normal affect   Signed, Jenean Lindau, MD  06/25/2020 2:28 PM    [place]

## 2020-06-25 NOTE — Patient Instructions (Signed)
Medication Instructions:  No medication changes. *If you need a refill on your cardiac medications before your next appointment, please call your pharmacy*   Lab Work:  Your physician recommends that you return for lab work in: next few days You need to have labs done when you are fasting.  You can come Monday through Friday 8:30 am to 12:00 pm and 1:15 to 4:30. You do not need to make an appointment as the order has already been placed. The labs you are going to have done are BMET, CBC, TSH, vitamin D, LFT and Lipids.  If you have labs (blood work) drawn today and your tests are completely normal, you will receive your results only by: Marland Kitchen MyChart Message (if you have MyChart) OR . A paper copy in the mail If you have any lab test that is abnormal or we need to change your treatment, we will call you to review the results.   Testing/Procedures: None ordered   Follow-Up: At [facility], you and your health needs are our priority.  As part of our continuing mission to provide you with exceptional heart care, we have created designated Provider Care Teams.  These Care Teams include your primary Cardiologist (physician) and Advanced Practice Providers (APPs -  Physician Assistants and Nurse Practitioners) who all work together to provide you with the care you need, when you need it.  We recommend signing up for the patient portal called "MyChart".  Sign up information is provided on this After Visit Summary.  MyChart is used to connect with patients for Virtual Visits (Telemedicine).  Patients are able to view lab/test results, encounter notes, upcoming appointments, etc.  Non-urgent messages can be sent to your provider as well.   To learn more about what you can do with MyChart, go to [email or web address].    Your next appointment:   3 month(s)  The format for your next appointment:   In Person  Provider:   Jyl Heinz, MD   Other Instructions NA

## 2020-06-25 NOTE — Addendum Note (Signed)
Addended by: Jakyle Petrucelli, Jonelle Sidle L on: 06/25/2020 04:30 PM   Modules accepted: Orders

## 2020-06-26 ENCOUNTER — Other Ambulatory Visit: Payer: Self-pay | Admitting: Cardiology

## 2020-07-01 LAB — CBC WITH DIFFERENTIAL/PLATELET
Basophils Absolute: 0 10*3/uL (ref 0.0–0.2)
Basos: 1 %
EOS (ABSOLUTE): 0.3 10*3/uL (ref 0.0–0.4)
Eos: 5 %
Hematocrit: 45.4 % (ref 37.5–51.0)
Hemoglobin: 15.2 g/dL (ref 13.0–17.7)
Immature Grans (Abs): 0 10*3/uL (ref 0.0–0.1)
Immature Granulocytes: 0 %
Lymphocytes Absolute: 1.5 10*3/uL (ref 0.7–3.1)
Lymphs: 23 %
MCH: 33.5 pg — ABNORMAL HIGH (ref 26.6–33.0)
MCHC: 33.5 g/dL (ref 31.5–35.7)
MCV: 100 fL — ABNORMAL HIGH (ref 79–97)
Monocytes Absolute: 0.7 10*3/uL (ref 0.1–0.9)
Monocytes: 11 %
Neutrophils Absolute: 3.8 10*3/uL (ref 1.4–7.0)
Neutrophils: 60 %
Platelets: 282 10*3/uL (ref 150–450)
RBC: 4.54 x10E6/uL (ref 4.14–5.80)
RDW: 12.9 % (ref 11.6–15.4)
WBC: 6.3 10*3/uL (ref 3.4–10.8)

## 2020-07-01 LAB — HEPATIC FUNCTION PANEL
ALT: 20 IU/L (ref 0–44)
AST: 17 IU/L (ref 0–40)
Albumin: 4.1 g/dL (ref 3.8–4.8)
Alkaline Phosphatase: 77 IU/L (ref 44–121)
Bilirubin Total: 0.4 mg/dL (ref 0.0–1.2)
Bilirubin, Direct: 0.2 mg/dL (ref 0.00–0.40)
Total Protein: 6.5 g/dL (ref 6.0–8.5)

## 2020-07-01 LAB — LIPID PANEL
Chol/HDL Ratio: 2 ratio (ref 0.0–5.0)
Cholesterol, Total: 90 mg/dL — ABNORMAL LOW (ref 100–199)
HDL: 44 mg/dL (ref >39)
LDL Chol Calc (NIH): 34 mg/dL (ref 0–99)
Triglycerides: 45 mg/dL (ref 0–149)
VLDL Cholesterol Cal: 12 mg/dL (ref 5–40)

## 2020-07-01 LAB — BASIC METABOLIC PANEL
BUN/Creatinine Ratio: 19 (ref 10–24)
BUN: 26 mg/dL (ref 8–27)
CO2: 22 mmol/L (ref 20–29)
Calcium: 9.2 mg/dL (ref 8.6–10.2)
Chloride: 103 mmol/L (ref 96–106)
Creatinine, Ser: 1.35 mg/dL — ABNORMAL HIGH (ref 0.76–1.27)
GFR calc Af Amer: 62 mL/min/{1.73_m2} (ref >59)
GFR calc non Af Amer: 54 mL/min/{1.73_m2} — ABNORMAL LOW (ref >59)
Glucose: 92 mg/dL (ref 65–99)
Potassium: 5.6 mmol/L — ABNORMAL HIGH (ref 3.5–5.2)
Sodium: 140 mmol/L (ref 134–144)

## 2020-07-01 LAB — TSH: TSH: 4.14 u[IU]/mL (ref 0.450–4.500)

## 2020-07-01 LAB — VITAMIN D 25 HYDROXY (VIT D DEFICIENCY, FRACTURES): Vit D, 25-Hydroxy: 51.9 ng/mL (ref 30.0–100.0)

## 2020-07-02 ENCOUNTER — Telehealth: Payer: Self-pay

## 2020-07-02 NOTE — Telephone Encounter (Signed)
Full VM 

## 2020-07-03 MED ORDER — SODIUM POLYSTYRENE SULFONATE 15 GM/60ML PO SUSP: 30.0000 g | Freq: Once | ORAL | 0 refills | Status: AC

## 2020-07-03 NOTE — Addendum Note (Signed)
Addended by: Truddie Hidden on: 07/03/2020 02:18 PM   Modules accepted: Orders

## 2020-07-17 LAB — BASIC METABOLIC PANEL
BUN/Creatinine Ratio: 15 (ref 10–24)
BUN: 22 mg/dL (ref 8–27)
CO2: 25 mmol/L (ref 20–29)
Calcium: 9.2 mg/dL (ref 8.6–10.2)
Chloride: 104 mmol/L (ref 96–106)
Creatinine, Ser: 1.5 mg/dL — ABNORMAL HIGH (ref 0.76–1.27)
GFR calc Af Amer: 55 mL/min/{1.73_m2} — ABNORMAL LOW (ref >59)
GFR calc non Af Amer: 47 mL/min/{1.73_m2} — ABNORMAL LOW (ref >59)
Glucose: 92 mg/dL (ref 65–99)
Potassium: 4.8 mmol/L (ref 3.5–5.2)
Sodium: 139 mmol/L (ref 134–144)

## 2020-08-12 ENCOUNTER — Other Ambulatory Visit: Payer: Self-pay | Admitting: Cardiology

## 2020-08-12 MED ORDER — ELIQUIS 2.5 MG PO TABS: 2.5000 mg | ORAL_TABLET | Freq: Two times a day (BID) | ORAL | 5 refills | Status: DC

## 2020-08-12 NOTE — Telephone Encounter (Signed)
Refill sent in per request.  

## 2020-08-12 NOTE — Telephone Encounter (Signed)
*  STAT* If patient is at the pharmacy, call can be transferred to refill team.   1. Which medications need to be refilled? (please list name of each medication and dose if known)  ELIQUIS 2.5 MG TABS tablet  2. Which pharmacy/location (including street and city if local pharmacy) is medication to be sent to? [id number] [place], [place] - [address]  3. Do they need a 30 day or 90 day supply? 30 day supply

## 2020-08-14 ENCOUNTER — Other Ambulatory Visit: Payer: Self-pay | Admitting: Cardiology

## 2020-09-01 ENCOUNTER — Ambulatory Visit (INDEPENDENT_AMBULATORY_CARE_PROVIDER_SITE_OTHER): Payer: Medicare Other | Admitting: Family Medicine

## 2020-09-01 ENCOUNTER — Encounter: Payer: Self-pay | Admitting: Family Medicine

## 2020-09-01 ENCOUNTER — Other Ambulatory Visit (HOSPITAL_BASED_OUTPATIENT_CLINIC_OR_DEPARTMENT_OTHER): Payer: Self-pay | Admitting: Internal Medicine

## 2020-09-01 ENCOUNTER — Other Ambulatory Visit: Payer: Self-pay

## 2020-09-01 ENCOUNTER — Ambulatory Visit: Payer: Medicare Other | Attending: Internal Medicine

## 2020-09-01 VITALS — BP 128/64 | HR 70 | Temp 97.5°F | Ht 69.0 in | Wt 243.5 lb

## 2020-09-01 DIAGNOSIS — D489 Neoplasm of uncertain behavior, unspecified: Secondary | ICD-10-CM

## 2020-09-01 DIAGNOSIS — Z23 Encounter for immunization: Secondary | ICD-10-CM

## 2020-09-01 DIAGNOSIS — L570 Actinic keratosis: Secondary | ICD-10-CM

## 2020-09-01 DIAGNOSIS — Z86711 Personal history of pulmonary embolism: Secondary | ICD-10-CM

## 2020-09-01 DIAGNOSIS — E875 Hyperkalemia: Secondary | ICD-10-CM | POA: Diagnosis not present

## 2020-09-01 HISTORY — DX: Actinic keratosis: L57.0

## 2020-09-01 MED ORDER — RIVAROXABAN 20 MG PO TABS: 20.0000 mg | ORAL_TABLET | Freq: Every day | ORAL | 5 refills | Status: DC

## 2020-09-01 MED FILL — PFIZER-BIONTECH COVID-19 VA: 30 | 1 days supply | Qty: 0 | Fill #0

## 2020-09-01 NOTE — Addendum Note (Signed)
Addended by: Ames Coupe on: 09/01/2020 04:55 PM   Modules accepted: Orders

## 2020-09-01 NOTE — Patient Instructions (Addendum)
Let me know either way regarding the cost of the Xarelto. Another name to consider would be Pradaxa.  Give [place] 2-3 business days to get the results of your labs back.   If potassium is normal, we can monitor routinely.   Do not shower for the rest of the day. When you do wash it, use only soap and water. Do not vigorously scrub. Apply triple antibiotic ointment (like Neosporin) twice daily. Keep the area clean and dry.   Things to look out for: increasing pain not relieved by ibuprofen/acetaminophen, fevers, spreading redness, drainage of pus, or foul odor.  Let us know if you need anything.

## 2020-09-01 NOTE — Progress Notes (Addendum)
Chief Complaint  Patient presents with  . New Patient (Initial Visit)       New Patient Visit SUBJECTIVE: HPI: Thomas Carpenter is an 69 y.o.male who is being seen for establishing care.  The patient is new to area.  He has a hx of DVT and PE. He is on lifelong anticoagulation, currently on Eliquis 2.5 mg bid. It is pricey and he is wondering if he is able to get something else. He had no known triggers.   Skin lesion on L ear for the past several months. Unsure exactly when it started. No new topicals. It will scab over and then return to a scaly lesion. This cycle has continued throughout its duration. No pain or drainage. He used to be a Careers adviser and wore a cap, but no sun protection for his ears.  Pt has a hx of hyperkalemia. He was given a couple doses of Kayexalate but it is difficult to take. His most recent check was in mid Oct. He is on Entresto and Aldactone for CHF per the cardiology team. He has not take [place] or Kayexalate recently.   Past Medical History:  Diagnosis Date  . DVT (deep venous thrombosis) ([place])   . Hypertension   . Pneumonia   . Pulmonary emboli [facility])    Past Surgical History:  Procedure Laterality Date  . CORONARY STENT INTERVENTION N/A 12/13/2019   Procedure: CORONARY STENT INTERVENTION;  Surgeon: Belva Crome, MD;  Location: [place] [facility];  Service: Cardiovascular;  Laterality: N/A;  . CORONARY/GRAFT ACUTE MI REVASCULARIZATION N/A 12/13/2019   Procedure: Coronary/Graft Acute MI Revascularization;  Surgeon: Belva Crome, MD;  Location: [place] [facility];  Service: Cardiovascular;  Laterality: N/A;  . IABP INSERTION N/A 12/13/2019   Procedure: IABP Insertion;  Surgeon: Belva Crome, MD;  Location: [place] [facility];  Service: Cardiovascular;  Laterality: N/A;  . LEFT HEART CATH AND CORONARY ANGIOGRAPHY N/A 12/13/2019   Procedure: LEFT HEART CATH AND CORONARY ANGIOGRAPHY;  Surgeon: Belva Crome, MD;  Location: [place] [facility];  Service:  Cardiovascular;  Laterality: N/A;   Family History  Problem Relation Age of Onset  . Kidney disease Mother    No Known Allergies  Current Outpatient Medications:  .  atorvastatin (LIPITOR) 80 MG tablet, TAKE 1 TABLET (80 MG TOTAL) BY MOUTH DAILY AT 6 PM., Disp: 30 tablet, Rfl: 10 .  Cholecalciferol (VITAMIN D3) 125 MCG (5000 UT) CAPS, Take 5,000 Units by mouth daily with breakfast., Disp: , Rfl:  .  clobetasol ointment (TEMOVATE) 1.93 %, Apply 1 application topically 2 (two) times daily., Disp: , Rfl:  .  clopidogrel (PLAVIX) 75 MG tablet, TAKE 1 TABLET (75 MG TOTAL) BY MOUTH DAILY WITH BREAKFAST., Disp: 90 tablet, Rfl: 3 .  nitroGLYCERIN (NITROSTAT) 0.4 MG SL tablet, Place 1 tablet (0.4 mg total) under the tongue every 5 (five) minutes x 3 doses as needed for chest pain., Disp: 25 tablet, Rfl: 3 .  sacubitril-valsartan (ENTRESTO) 97-103 MG, Take 1 tablet by mouth 2 (two) times daily., Disp: 60 tablet, Rfl: 6 .  spironolactone (ALDACTONE) 25 MG tablet, Take 0.5 tablets (12.5 mg total) by mouth daily., Disp: 30 tablet, Rfl: 5 .  rivaroxaban (XARELTO) 20 MG TABS tablet, Take 1 tablet (20 mg total) by mouth daily with supper., Disp: 30 tablet, Rfl: 5  OBJECTIVE: BP 128/64 (BP Location: Left Arm, Patient Position: Sitting, Cuff Size: Normal)   Pulse 70   Temp (!) 97.5 F (36.4  C) (Oral)   Ht 5\' 9"  (1.753 m)   Wt 243 lb 8 oz (110.5 kg)   SpO2 97%   BMI 35.96 kg/m  General:  well developed, well nourished, in no apparent distress Skin: Raised and scaled lesion over hte helix of the L ear, measuring approx 4 mm in diameter. Otherwise no significant moles, warts, or growths; there are also scaly macules and patches on the face and side of neck with pinkish/red base.  Lungs: no respiratory distress Neuro:  gait normal  Psych: well oriented with normal range of affect and appropriate judgment/insight  Procedure note; shave biopsy Informed consent was obtained. Indication: biopsy/diagnostic.   The area was cleaned with alcohol and injected with 0.2 mL of 2% lidocaine without epinephrine. A Dermablade was slightly bent and used to cut under the area of interest. The specimen was placed in a sterile specimen cup and sent to the lab. The area was then cauterized ensuring adequate hemostasis. The area was dressed with triple antibiotic ointment and a bandage. There were no complications noted. The patient tolerated the procedure well.  Procedure note: cryotherapy Verbal consent obtained 8 skin lesions treated Liquid nitrogen was applied via a thin spray creating an ice ball with 1-2 mm corona surrounding the lesion The patient tolerated the procedure well There were no immediate complications noted  ASSESSMENT/PLAN: History of pulmonary embolism - Plan: rivaroxaban (XARELTO) 20 MG TABS tablet  Hyperkalemia - Plan: Basic metabolic panel  Neoplasm of uncertain behavior - Plan: Surgical pathology( [place]/ [facility]), PR SHAV SKIN LES <0.5 CM FACE,FACIAL  Actinic keratoses - Plan: PR DESTRUC PREMAL,FIRST LESION, PR DESTRUC BENIGN/PREMAL,2-14 LESIONS  1. Send in Xarelto to see if it is covered. Could try Pradaxa.  2. Ck BMP today. He is on Entresto and Aldactone. 3. Shave biopsy today. Sun protection rec'd. Aftercare instructions written down and verbalized.  4. Cryo today. If linger or evolution, let us know.  Patient instructed to sign release of records form from their previous PCP. Patient should return in 6 mo for AWV. The patient voiced understanding and agreement to the plan.   [place], DO 09/01/20  4:55 PM

## 2020-09-02 ENCOUNTER — Other Ambulatory Visit: Payer: Self-pay | Admitting: Family Medicine

## 2020-09-02 DIAGNOSIS — E875 Hyperkalemia: Secondary | ICD-10-CM

## 2020-09-02 LAB — BASIC METABOLIC PANEL
BUN: 25 mg/dL — ABNORMAL HIGH (ref 6–23)
CO2: 29 mEq/L (ref 19–32)
Calcium: 9.2 mg/dL (ref 8.4–10.5)
Chloride: 100 mEq/L (ref 96–112)
Creatinine, Ser: 1.44 mg/dL (ref 0.40–1.50)
GFR: 50.1 mL/min — ABNORMAL LOW (ref >60.00)
Glucose, Bld: 86 mg/dL (ref 70–99)
Potassium: 5.2 mEq/L — ABNORMAL HIGH (ref 3.5–5.1)
Sodium: 135 mEq/L (ref 135–145)

## 2020-09-02 MED ORDER — HYDROCHLOROTHIAZIDE 12.5 MG PO TABS
12.5000 mg | ORAL_TABLET | Freq: Every day | ORAL | 3 refills | Status: DC
Start: 2020-09-02 — End: 2021-01-03

## 2020-09-08 ENCOUNTER — Other Ambulatory Visit (INDEPENDENT_AMBULATORY_CARE_PROVIDER_SITE_OTHER): Payer: Medicare Other

## 2020-09-08 ENCOUNTER — Other Ambulatory Visit: Payer: Self-pay

## 2020-09-08 DIAGNOSIS — E875 Hyperkalemia: Secondary | ICD-10-CM

## 2020-09-09 ENCOUNTER — Other Ambulatory Visit: Payer: Self-pay | Admitting: Family Medicine

## 2020-09-09 DIAGNOSIS — E871 Hypo-osmolality and hyponatremia: Secondary | ICD-10-CM

## 2020-09-09 LAB — BASIC METABOLIC PANEL
BUN: 24 mg/dL — ABNORMAL HIGH (ref 6–23)
CO2: 25 mEq/L (ref 19–32)
Calcium: 9.4 mg/dL (ref 8.4–10.5)
Chloride: 99 mEq/L (ref 96–112)
Creatinine, Ser: 1.44 mg/dL (ref 0.40–1.50)
GFR: 50.09 mL/min — ABNORMAL LOW (ref >60.00)
Glucose, Bld: 82 mg/dL (ref 70–99)
Potassium: 4.8 mEq/L (ref 3.5–5.1)
Sodium: 132 mEq/L — ABNORMAL LOW (ref 135–145)

## 2020-09-11 DIAGNOSIS — I2699 Other pulmonary embolism without acute cor pulmonale: Secondary | ICD-10-CM | POA: Insufficient documentation

## 2020-09-11 DIAGNOSIS — I10 Essential (primary) hypertension: Secondary | ICD-10-CM | POA: Insufficient documentation

## 2020-09-11 DIAGNOSIS — J189 Pneumonia, unspecified organism: Secondary | ICD-10-CM | POA: Insufficient documentation

## 2020-09-11 DIAGNOSIS — I82409 Acute embolism and thrombosis of unspecified deep veins of unspecified lower extremity: Secondary | ICD-10-CM | POA: Insufficient documentation

## 2020-09-12 ENCOUNTER — Ambulatory Visit: Payer: Medicare Other | Admitting: Cardiology

## 2020-09-23 ENCOUNTER — Other Ambulatory Visit (INDEPENDENT_AMBULATORY_CARE_PROVIDER_SITE_OTHER): Payer: Medicare Other

## 2020-09-23 ENCOUNTER — Other Ambulatory Visit: Payer: Self-pay

## 2020-09-23 DIAGNOSIS — E871 Hypo-osmolality and hyponatremia: Secondary | ICD-10-CM | POA: Diagnosis not present

## 2020-09-24 ENCOUNTER — Other Ambulatory Visit (HOSPITAL_COMMUNITY): Payer: Self-pay

## 2020-09-24 LAB — BASIC METABOLIC PANEL
BUN: 22 mg/dL (ref 6–23)
CO2: 28 mEq/L (ref 19–32)
Calcium: 9 mg/dL (ref 8.4–10.5)
Chloride: 97 mEq/L (ref 96–112)
Creatinine, Ser: 1.45 mg/dL (ref 0.40–1.50)
GFR: 49.67 mL/min — ABNORMAL LOW (ref >60.00)
Glucose, Bld: 76 mg/dL (ref 70–99)
Potassium: 4.4 mEq/L (ref 3.5–5.1)
Sodium: 132 mEq/L — ABNORMAL LOW (ref 135–145)

## 2020-09-24 MED ORDER — ENTRESTO 97-103 MG PO TABS: 1.0000 | ORAL_TABLET | Freq: Two times a day (BID) | ORAL | 6 refills | Status: DC

## 2020-10-21 ENCOUNTER — Ambulatory Visit: Payer: Medicare Other | Admitting: Cardiology

## 2020-10-30 ENCOUNTER — Ambulatory Visit (INDEPENDENT_AMBULATORY_CARE_PROVIDER_SITE_OTHER): Payer: Medicare Other | Admitting: Cardiology

## 2020-10-30 ENCOUNTER — Encounter: Payer: Self-pay | Admitting: Cardiology

## 2020-10-30 ENCOUNTER — Other Ambulatory Visit: Payer: Self-pay

## 2020-10-30 VITALS — BP 118/64 | HR 60 | Ht 69.0 in | Wt 246.0 lb

## 2020-10-30 DIAGNOSIS — E669 Obesity, unspecified: Secondary | ICD-10-CM

## 2020-10-30 DIAGNOSIS — I2510 Atherosclerotic heart disease of native coronary artery without angina pectoris: Secondary | ICD-10-CM

## 2020-10-30 DIAGNOSIS — Z955 Presence of coronary angioplasty implant and graft: Secondary | ICD-10-CM | POA: Diagnosis not present

## 2020-10-30 DIAGNOSIS — E782 Mixed hyperlipidemia: Secondary | ICD-10-CM | POA: Diagnosis not present

## 2020-10-30 DIAGNOSIS — Z86711 Personal history of pulmonary embolism: Secondary | ICD-10-CM

## 2020-10-30 NOTE — Patient Instructions (Signed)

## 2020-10-30 NOTE — Progress Notes (Signed)
Cardiology Office Note:    Date:  10/30/2020   ID:  Gumaro Brightbill, DOB 1951-07-06, MRN [id number]  PCP:  Shelda Pal, DO  Cardiologist:  Jenean Lindau, MD   Referring MD: Shelda Pal*    ASSESSMENT:    1. Coronary artery disease involving native coronary artery of native heart without angina pectoris   2. Status post coronary artery stent placement   3. Obesity (BMI 35.0-39.9 without comorbidity)   4. Mixed dyslipidemia   5. History of pulmonary embolism    PLAN:    In order of problems listed above:  1. Coronary artery disease: Secondary prevention stressed with the patient.  Importance of compliance with diet medication stressed any vocalized understanding.  He was advised to walk every day half an hour at least and he promises to do so. 2. History of cardiomyopathy: Stable at this time.  Last ejection fraction is normal and he is tolerating Entresto well.  He will continue current medications.  Electrolytes were reviewed. 3. Mixed dyslipidemia: I discussed findings at length and he is very compliant with advice about diet.  He tells me that he was a little lax with the holidays. 4. History of pulmonary embolism on anticoagulation: Benefits and potential risks of anticoagulation explained.  He understands.  This is managed by his primary care provider. 5. Obesity: Diet emphasized risks of obesity explained and stressed.  He understands.Patient will be seen in follow-up appointment in 6 months or earlier if the patient has any concerns   Medication Adjustments/Labs and Tests Ordered: Current medicines are reviewed at length with the patient today.  Concerns regarding medicines are outlined above.  No orders of the defined types were placed in this encounter.  No orders of the defined types were placed in this encounter.    No chief complaint on file.    History of Present Illness:    Jt Brabec is a 70 y.o. male.  Patient has past medical  history of coronary artery disease, essential hypertension dyslipidemia and congestive heart failure.  He has had a ejection fraction which was very depressed in the past but subsequently has come back to normal.  He has significant obesity.  He exercises on a regular basis.  No chest pain orthopnea or PND.  At the time of my evaluation, the patient is alert awake oriented and in no distress.  Past Medical History:  Diagnosis Date  . Actinic keratoses 09/01/2020  . Acute ST elevation myocardial infarction (STEMI) due to occlusion of distal portion of left anterior descending (LAD) coronary artery ([place]) 12/14/2019  . Acute ST elevation myocardial infarction (STEMI) due to occlusion of left anterior descending (LAD) coronary artery ([place]) 12/13/2019  . Acute systolic congestive heart failure ([place]) 12/13/2019  . Chronic systolic heart failure ([place]) 01/21/2020  . CKD (chronic kidney disease) stage 4, GFR 15-29 ml/min (HCC) 12/13/2019  . Coronary artery disease 06/25/2020  . DVT (deep venous thrombosis) ([place])   . History of pulmonary embolism 06/25/2020  . Hypertension   . Mixed dyslipidemia 06/25/2020  . Obesity (BMI 35.0-39.9 without comorbidity) 06/25/2020  . On intra-aortic balloon pump assist   . Pneumonia   . Pulmonary emboli ([place])   . Pulmonary embolism during current hospitalization ([place]) 12/13/2019  . ST elevation myocardial infarction (STEMI) ([place])   . Status post coronary artery stent placement     Past Surgical History:  Procedure Laterality Date  . CORONARY STENT INTERVENTION N/A 12/13/2019   Procedure: CORONARY STENT INTERVENTION;  Surgeon: Belva Crome, MD;  Location: [facility];  Service: Cardiovascular;  Laterality: N/A;  . CORONARY/GRAFT ACUTE MI REVASCULARIZATION N/A 12/13/2019   Procedure: Coronary/Graft Acute MI Revascularization;  Surgeon: Belva Crome, MD;  Location: [facility];  Service: Cardiovascular;  Laterality: N/A;  . IABP INSERTION N/A 12/13/2019    Procedure: IABP Insertion;  Surgeon: Belva Crome, MD;  Location: [facility];  Service: Cardiovascular;  Laterality: N/A;  . LEFT HEART CATH AND CORONARY ANGIOGRAPHY N/A 12/13/2019   Procedure: LEFT HEART CATH AND CORONARY ANGIOGRAPHY;  Surgeon: Belva Crome, MD;  Location: [facility];  Service: Cardiovascular;  Laterality: N/A;    Current Medications: Current Meds  Medication Sig  . atorvastatin (LIPITOR) 80 MG tablet TAKE 1 TABLET (80 MG TOTAL) BY MOUTH DAILY AT 6 PM.  . Cholecalciferol (VITAMIN D3) 125 MCG (5000 UT) CAPS Take 5,000 Units by mouth daily with breakfast.  . clobetasol ointment (TEMOVATE) 1.61 % Apply 1 application topically as needed.  . clopidogrel (PLAVIX) 75 MG tablet TAKE 1 TABLET (75 MG TOTAL) BY MOUTH DAILY WITH BREAKFAST.  . hydrochlorothiazide (HYDRODIURIL) 12.5 MG tablet Take 1 tablet (12.5 mg total) by mouth daily.  . nitroGLYCERIN (NITROSTAT) 0.4 MG SL tablet Place 1 tablet (0.4 mg total) under the tongue every 5 (five) minutes x 3 doses as needed for chest pain.  . rivaroxaban (XARELTO) 20 MG TABS tablet Take 1 tablet (20 mg total) by mouth daily with supper.  . sacubitril-valsartan (ENTRESTO) 97-103 MG Take 1 tablet by mouth 2 (two) times daily.  Marland Kitchen spironolactone (ALDACTONE) 25 MG tablet Take 0.5 tablets (12.5 mg total) by mouth daily.     Allergies:   Patient has no known allergies.   Social History   Socioeconomic History  . Marital status: Single    Spouse name: Not on file  . Number of children: Not on file  . Years of education: Not on file  . Highest education level: Not on file  Occupational History  . Not on file  Tobacco Use  . Smoking status: Never Smoker  . Smokeless tobacco: Current User    Types: Chew  Vaping Use  . Vaping Use: Never used  Substance and Sexual Activity  . Alcohol use: Yes    Comment: daily  . Drug use: Never  . Sexual activity: Not on file  Other Topics Concern  . Not on file  Social History  Narrative  . Not on file   Social Determinants of Health   Financial Resource Strain: Not on file  Food Insecurity: Not on file  Transportation Needs: Not on file  Physical Activity: Not on file  Stress: Not on file  Social Connections: Not on file     Family History: The patient's family history includes Kidney disease in his mother.  ROS:   Please see the history of present illness.    All other systems reviewed and are negative.  EKGs/Labs/Other Studies Reviewed:    The following studies were reviewed today: IMPRESSIONS    1. Left ventricular ejection fraction, by estimation, is 50 to 55%. The  left ventricle has low normal function. The left ventricle demonstrates  regional wall motion abnormalities (see scoring diagram/findings for  description). Left ventricular diastolic  parameters are consistent with Grade II diastolic dysfunction  (pseudonormalization). There is hypokinesis of the left ventricular,  basal-mid anteroseptal wall.  2. Right ventricular systolic function is normal. The right ventricular  size is  normal.  3. Left atrial size was mildly dilated.  4. The mitral valve is normal in structure. Mild mitral valve  regurgitation.  5. The aortic valve is tricuspid. Aortic valve regurgitation is trivial.  6. The inferior vena cava is normal in size with greater than 50%  respiratory variability, suggesting right atrial pressure of 3 mmHg.   FINDINGS    Recent Labs: 06/30/2020: ALT 20; Hemoglobin 15.2; Platelets 282; TSH 4.140 09/23/2020: BUN 22; Creatinine, Ser 1.45; Potassium 4.4; Sodium 132  Recent Lipid Panel    Component Value Date/Time   CHOL 90 (L) 06/30/2020 1019   TRIG 45 06/30/2020 1019   HDL 44 06/30/2020 1019   CHOLHDL 2.0 06/30/2020 1019   CHOLHDL 4.0 01/09/2020 1416   VLDL 25 01/09/2020 1416   LDLCALC 34 06/30/2020 1019    Physical Exam:    VS:  BP 118/64   Pulse 60   Ht 5\' 9"  (1.753 m)   Wt 246 lb (111.6 kg)   SpO2 97%    BMI 36.33 kg/m     Wt Readings from Last 3 Encounters:  10/30/20 246 lb (111.6 kg)  09/01/20 243 lb 8 oz (110.5 kg)  06/25/20 244 lb 0.6 oz (110.7 kg)     GEN: Patient is in no acute distress HEENT: Normal NECK: No JVD; No carotid bruits LYMPHATICS: No lymphadenopathy CARDIAC: Hear sounds regular, 2/6 systolic murmur at the apex. RESPIRATORY:  Clear to auscultation without rales, wheezing or rhonchi  ABDOMEN: Soft, non-tender, non-distended MUSCULOSKELETAL:  No edema; No deformity  SKIN: Warm and dry NEUROLOGIC:  Alert and oriented x 3 PSYCHIATRIC:  Normal affect   Signed, Jenean Lindau, MD  10/30/2020 4:07 PM    [facility]

## 2020-11-21 ENCOUNTER — Other Ambulatory Visit: Payer: Self-pay | Admitting: Cardiology

## 2021-01-03 ENCOUNTER — Other Ambulatory Visit: Payer: Self-pay | Admitting: Family Medicine

## 2021-01-29 ENCOUNTER — Other Ambulatory Visit: Payer: Self-pay

## 2021-01-29 ENCOUNTER — Telehealth: Payer: Self-pay

## 2021-01-29 MED ORDER — NITROGLYCERIN 0.4 MG SL SUBL: 0.4000 mg | SUBLINGUAL_TABLET | SUBLINGUAL | 3 refills | Status: DC | PRN

## 2021-01-29 NOTE — Telephone Encounter (Signed)
Patient reports that his pharmacy will not refill his rx of nitroglycerin because it has not been approved. Patient is inquiring about status of approval, and requests call back on [phone].

## 2021-01-29 NOTE — Telephone Encounter (Signed)
Refill sent to pharmacy.   

## 2021-01-29 NOTE — Progress Notes (Signed)
Refill sent to pharmacy.   

## 2021-02-25 NOTE — Progress Notes (Signed)
Subjective:   Thomas Carpenter is a 70 y.o. male who presents for an Initial Medicare Annual Wellness Visit.  Review of Systems     Cardiac Risk Factors include: advanced age (>20men, >26 women);male gender;dyslipidemia;hypertension;obesity (BMI >30kg/m2)     Objective:    Today's Vitals   02/26/21 1318  BP: 122/84  Pulse: 60  Resp: 16  Temp: 98 F (36.7 C)  TempSrc: Temporal  Weight: 246 lb 12.8 oz (111.9 kg)  Height: 5\' 9"  (1.753 m)   Body mass index is 36.45 kg/m.  Advanced Directives 02/26/2021 12/14/2019 12/13/2019  Does Patient Have a Medical Advance Directive? Yes - No  Type of Paramedic of [place];Living will - -  Copy of [place] in Chart? No - copy requested - -  Would patient like information on creating a medical advance directive? - No - Patient declined -    Current Medications (verified) Outpatient Encounter Medications as of 02/26/2021  Medication Sig  . atorvastatin (LIPITOR) 80 MG tablet TAKE 1 TABLET (80 MG TOTAL) BY MOUTH DAILY AT 6 PM.  . Cholecalciferol (VITAMIN D3) 125 MCG (5000 UT) CAPS Take 5,000 Units by mouth daily with breakfast.  . clobetasol ointment (TEMOVATE) 4.48 % Apply 1 application topically as needed.  . clopidogrel (PLAVIX) 75 MG tablet TAKE 1 TABLET (75 MG TOTAL) BY MOUTH DAILY WITH BREAKFAST.  Marland Kitchen COVID-19 mRNA vaccine, Pfizer, 30 MCG/0.3ML injection INJECT AS DIRECTED  . hydrochlorothiazide (HYDRODIURIL) 12.5 MG tablet TAKE 1 TABLET BY MOUTH EVERY DAY  . nitroGLYCERIN (NITROSTAT) 0.4 MG SL tablet Place 1 tablet (0.4 mg total) under the tongue every 5 (five) minutes x 3 doses as needed for chest pain.  . rivaroxaban (XARELTO) 20 MG TABS tablet Take 1 tablet (20 mg total) by mouth daily with supper.  . sacubitril-valsartan (ENTRESTO) 97-103 MG Take 1 tablet by mouth 2 (two) times daily.  Marland Kitchen spironolactone (ALDACTONE) 25 MG tablet TAKE 1/2 TABLET BY MOUTH EVERY DAY   No facility-administered  encounter medications on file as of 02/26/2021.    Allergies (verified) Patient has no known allergies.   History: Past Medical History:  Diagnosis Date  . Actinic keratoses 09/01/2020  . Acute ST elevation myocardial infarction (STEMI) due to occlusion of distal portion of left anterior descending (LAD) coronary artery ([place]) 12/14/2019  . Acute ST elevation myocardial infarction (STEMI) due to occlusion of left anterior descending (LAD) coronary artery ([place]) 12/13/2019  . Acute systolic congestive heart failure ([place]) 12/13/2019  . Chronic systolic heart failure ([place]) 01/21/2020  . CKD (chronic kidney disease) stage 4, GFR 15-29 ml/min (HCC) 12/13/2019  . Coronary artery disease 06/25/2020  . DVT (deep venous thrombosis) ([place])   . History of pulmonary embolism 06/25/2020  . Hypertension   . Mixed dyslipidemia 06/25/2020  . Obesity (BMI 35.0-39.9 without comorbidity) 06/25/2020  . On intra-aortic balloon pump assist   . Pneumonia   . Pulmonary emboli ([place])   . Pulmonary embolism during current hospitalization ([place]) 12/13/2019  . ST elevation myocardial infarction (STEMI) ([place])   . Status post coronary artery stent placement    Past Surgical History:  Procedure Laterality Date  . CORONARY STENT INTERVENTION N/A 12/13/2019   Procedure: CORONARY STENT INTERVENTION;  Surgeon: Belva Crome, MD;  Location: [facility];  Service: Cardiovascular;  Laterality: N/A;  . CORONARY/GRAFT ACUTE MI REVASCULARIZATION N/A 12/13/2019   Procedure: Coronary/Graft Acute MI Revascularization;  Surgeon: Belva Crome, MD;  Location: [facility];  Service: Cardiovascular;  Laterality: N/A;  . IABP INSERTION N/A 12/13/2019   Procedure: IABP Insertion;  Surgeon: Belva Crome, MD;  Location: [facility];  Service: Cardiovascular;  Laterality: N/A;  . LEFT HEART CATH AND CORONARY ANGIOGRAPHY N/A 12/13/2019   Procedure: LEFT HEART CATH AND CORONARY ANGIOGRAPHY;  Surgeon: Belva Crome, MD;   Location: [facility];  Service: Cardiovascular;  Laterality: N/A;   Family History  Problem Relation Age of Onset  . Kidney disease Mother    Social History   Socioeconomic History  . Marital status: Single    Spouse name: Not on file  . Number of children: Not on file  . Years of education: Not on file  . Highest education level: Not on file  Occupational History  . Occupation: retired  Tobacco Use  . Smoking status: Never Smoker  . Smokeless tobacco: Current User    Types: Chew  Vaping Use  . Vaping Use: Never used  Substance and Sexual Activity  . Alcohol use: Yes    Comment: daily  . Drug use: Never  . Sexual activity: Not on file  Other Topics Concern  . Not on file  Social History Narrative  . Not on file   Social Determinants of Health   Financial Resource Strain: Low Risk   . Difficulty of Paying Living Expenses: Not hard at all  Food Insecurity: No Food Insecurity  . Worried About Charity fundraiser in the Last Year: Never true  . Ran Out of Food in the Last Year: Never true  Transportation Needs: No Transportation Needs  . Lack of Transportation (Medical): No  . Lack of Transportation (Non-Medical): No  Physical Activity: Sufficiently Active  . Days of Exercise per Week: 5 days  . Minutes of Exercise per Session: 30 min  Stress: No Stress Concern Present  . Feeling of Stress : Not at all  Social Connections: Socially Isolated  . Frequency of Communication with Friends and Family: More than three times a week  . Frequency of Social Gatherings with Friends and Family: More than three times a week  . Attends Religious Services: Never  . Active Member of Clubs or Organizations: No  . Attends Archivist Meetings: Never  . Marital Status: Divorced    Tobacco Counseling Ready to quit: Not Answered Counseling given: Not Answered   Clinical Intake:  Pre-visit preparation completed: Yes  Pain : No/denies pain     Nutritional  Status: BMI > 30  Obese Nutritional Risks: None Diabetes: No  How often do you need to have someone help you when you read instructions, pamphlets, or other written materials from your doctor or pharmacy?: 1 - Never  Diabetic?No  Interpreter Needed?: No  Information entered by :: Caroleen Hamman LPN   Activities of Daily Living In your present state of health, do you have any difficulty performing the following activities: 02/26/2021  Hearing? N  Vision? N  Difficulty concentrating or making decisions? N  Walking or climbing stairs? N  Dressing or bathing? N  Doing errands, shopping? N  Some recent data might be hidden    Patient Care Team: Shelda Pal, DO as PCP - General (Family Medicine) Bensimhon, Shaune Pascal, MD as PCP - Advanced Heart Failure (Cardiology)  Indicate any recent Medical Services you may have received from other than [facility] providers in the past year (date may be approximate).     Assessment:   This is a routine wellness  examination for Thomas Carpenter.  Hearing/Vision screen  Hearing Screening   125Hz  250Hz  500Hz  1000Hz  2000Hz  3000Hz  4000Hz  6000Hz  8000Hz   Right ear:           Left ear:           Comments: No issues  Vision Screening Comments: Wears glasses Last eye exam-10/2020-Dr. Alexis Goodell  Dietary issues and exercise activities discussed: Current Exercise Habits: Home exercise routine, Type of exercise: walking;strength training/weights;stretching;treadmill, Time (Minutes): 30, Frequency (Times/Week): 5, Weekly Exercise (Minutes/Week): 150, Intensity: Mild, Exercise limited by: None identified  Goals Addressed            This Visit's Progress   . Patient Stated       Maintain current health      Depression Screen PHQ 2/9 Scores 02/26/2021  PHQ - 2 Score 0    Fall Risk Fall Risk  02/26/2021  Falls in the past year? 0  Number falls in past yr: 0  Injury with Fall? 0  Follow up Falls prevention discussed    FALL RISK PREVENTION  PERTAINING TO THE HOME:  Any stairs in or around the home? Yes  If so, are there any without handrails? No  Home free of loose throw rugs in walkways, pet beds, electrical cords, etc? Yes  Adequate lighting in your home to reduce risk of falls? Yes   ASSISTIVE DEVICES UTILIZED TO PREVENT FALLS:  Life alert? No  Use of a cane, walker or w/c? No  Grab bars in the bathroom? No  Shower chair or bench in shower? No  Elevated toilet seat or a handicapped toilet? No   TIMED UP AND GO:  Was the test performed? Yes .  Length of time to ambulate 10 feet: 10 sec.   Gait steady and fast without use of assistive device  Cognitive Function:Normal cognitive status assessed by direct observation by this Nurse Health Advisor. No abnormalities found.          Immunizations Immunization History  Administered Date(s) Administered  . Influenza, High Dose Seasonal PF 07/02/2020  . PFIZER(Purple Top)SARS-COV-2 Vaccination 11/08/2019, 11/29/2019, 09/01/2020    TDAP status: Due, Education has been provided regarding the importance of this vaccine. Advised may receive this vaccine at local pharmacy or Health Dept. Aware to provide a copy of the vaccination record if obtained from local pharmacy or Health Dept. Verbalized acceptance and understanding.  Flu Vaccine status: Up to date  Pneumococcal vaccine status: Up to date per patient -unsure of dates  Covid-19 vaccine status: Completed vaccines  Qualifies for Shingles Vaccine? Yes   Zostavax completed No   Shingrix Completed?: No.    Education has been provided regarding the importance of this vaccine. Patient has been advised to call insurance company to determine out of pocket expense if they have not yet received this vaccine. Advised may also receive vaccine at local pharmacy or Health Dept. Verbalized acceptance and understanding.  Screening Tests Health Maintenance  Topic Date Due  . Hepatitis C Screening  Never done  . TETANUS/TDAP   Never done  . COLONOSCOPY (Pts 45-63yrs Insurance coverage will need to be confirmed)  Never done  . Zoster Vaccines- Shingrix (1 of 2) Never done  . PNA vac Low Risk Adult (1 of 2 - PCV13) Never done  . COVID-19 Vaccine (4 - Booster for [place] series) 12/01/2020  . INFLUENZA VACCINE  05/04/2021  . HPV VACCINES  Aged Out    Health Maintenance  Health Maintenance Due  Topic Date Due  .  Hepatitis C Screening  Never done  . TETANUS/TDAP  Never done  . COLONOSCOPY (Pts 45-52yrs Insurance coverage will need to be confirmed)  Never done  . Zoster Vaccines- Shingrix (1 of 2) Never done  . PNA vac Low Risk Adult (1 of 2 - PCV13) Never done  . COVID-19 Vaccine (4 - Booster for Pfizer series) 12/01/2020    Colorectal cancer screening: Referral to GI placed today. Pt aware the office will call re: appt.  Lung Cancer Screening: (Low Dose CT Chest recommended if Age 61-80 years, 30 pack-year currently smoking OR have quit w/in 15years.) does not qualify.    Additional Screening:  Hepatitis C Screening: does qualify; Patient to discuss with PCP  Vision Screening: Recommended annual ophthalmology exams for early detection of glaucoma and other disorders of the eye. Is the patient up to date with their annual eye exam?  Yes  Who is the provider or what is the name of the office in which the patient attends annual eye exams? Dr. Eulas Post   Dental Screening: Recommended annual dental exams for proper oral hygiene  Community Resource Referral / Chronic Care Management: CRR required this visit?  No   CCM required this visit?  No      Plan:     I have personally reviewed and noted the following in the patient's chart:   . Medical and social history . Use of alcohol, tobacco or illicit drugs  . Current medications and supplements including opioid prescriptions. Patient is not currently taking opioid prescriptions. . Functional ability and status . Nutritional status . Physical  activity . Advanced directives . List of other physicians . Hospitalizations, surgeries, and ER visits in previous 12 months . Vitals . Screenings to include cognitive, depression, and falls . Referrals and appointments  In addition, I have reviewed and discussed with patient certain preventive protocols, quality metrics, and best practice recommendations. A written personalized care plan for preventive services as well as general preventive health recommendations were provided to patient.   Patient to access avs on mychart.  Marta Antu, LPN   11/25/9796  Nurse Health Advisor  Nurse Notes: None

## 2021-02-26 ENCOUNTER — Ambulatory Visit (INDEPENDENT_AMBULATORY_CARE_PROVIDER_SITE_OTHER): Payer: Medicare Other

## 2021-02-26 ENCOUNTER — Other Ambulatory Visit: Payer: Self-pay

## 2021-02-26 VITALS — BP 122/84 | HR 60 | Temp 98.0°F | Resp 16 | Ht 69.0 in | Wt 246.8 lb

## 2021-02-26 DIAGNOSIS — Z0000 Encounter for general adult medical examination without abnormal findings: Secondary | ICD-10-CM | POA: Diagnosis not present

## 2021-02-26 DIAGNOSIS — Z1211 Encounter for screening for malignant neoplasm of colon: Secondary | ICD-10-CM | POA: Diagnosis not present

## 2021-02-26 NOTE — Patient Instructions (Signed)
Mr. Thomas Carpenter , Thank you for taking time to come for your Medicare Wellness Visit. I appreciate your ongoing commitment to your health goals. Please review the following plan we discussed and let me know if I can assist you in the future.   Screening recommendations/referrals: Colonoscopy: Referral to GI ordered today.Someone will be calling you to schedule. Recommended yearly ophthalmology/optometry visit for glaucoma screening and checkup Recommended yearly dental visit for hygiene and checkup  Vaccinations: Influenza vaccine: Up to date Pneumococcal vaccine: Per our conversation today, vaccines probably completed with previous PCP.  Tdap vaccine: Unsure of last date Shingles vaccine: Never had Chicken Pox   Covid-19: Up to date  Advanced directives: Please bring a copy for your chart  Conditions/risks identified: See problem list  Next appointment: Follow up in one year for your annual wellness visit. 03/04/2022 @ 1:20  Preventive Care 69 Years and Older, Male Preventive care refers to lifestyle choices and visits with your health care provider that can promote health and wellness. What does preventive care include?  A yearly physical exam. This is also called an annual well check.  Dental exams once or twice a year.  Routine eye exams. Ask your health care provider how often you should have your eyes checked.  Personal lifestyle choices, including:  Daily care of your teeth and gums.  Regular physical activity.  Eating a healthy diet.  Avoiding tobacco and drug use.  Limiting alcohol use.  Practicing safe sex.  Taking low doses of aspirin every day.  Taking vitamin and mineral supplements as recommended by your health care provider. What happens during an annual well check? The services and screenings done by your health care provider during your annual well check will depend on your age, overall health, lifestyle risk factors, and family history of  disease. Counseling  Your health care provider may ask you questions about your:  Alcohol use.  Tobacco use.  Drug use.  Emotional well-being.  Home and relationship well-being.  Sexual activity.  Eating habits.  History of falls.  Memory and ability to understand (cognition).  Work and work Statistician. Screening  You may have the following tests or measurements:  Height, weight, and BMI.  Blood pressure.  Lipid and cholesterol levels. These may be checked every 5 years, or more frequently if you are over 37 years old.  Skin check.  Lung cancer screening. You may have this screening every year starting at age 51 if you have a 30-pack-year history of smoking and currently smoke or have quit within the past 15 years.  Fecal occult blood test (FOBT) of the stool. You may have this test every year starting at age 80.  Flexible sigmoidoscopy or colonoscopy. You may have a sigmoidoscopy every 5 years or a colonoscopy every 10 years starting at age 7.  Prostate cancer screening. Recommendations will vary depending on your family history and other risks.  Hepatitis C blood test.  Hepatitis B blood test.  Sexually transmitted disease (STD) testing.  Diabetes screening. This is done by checking your blood sugar (glucose) after you have not eaten for a while (fasting). You may have this done every 1-3 years.  Abdominal aortic aneurysm (AAA) screening. You may need this if you are a current or former smoker.  Osteoporosis. You may be screened starting at age 59 if you are at high risk. Talk with your health care provider about your test results, treatment options, and if necessary, the need for more tests. Vaccines  Your health  care provider may recommend certain vaccines, such as:  Influenza vaccine. This is recommended every year.  Tetanus, diphtheria, and acellular pertussis (Tdap, Td) vaccine. You may need a Td booster every 10 years.  Zoster vaccine. You may  need this after age 8.  Pneumococcal 13-valent conjugate (PCV13) vaccine. One dose is recommended after age 66.  Pneumococcal polysaccharide (PPSV23) vaccine. One dose is recommended after age 40. Talk to your health care provider about which screenings and vaccines you need and how often you need them. This information is not intended to replace advice given to you by your health care provider. Make sure you discuss any questions you have with your health care provider. Document Released: 10/17/2015 Document Revised: 06/09/2016 Document Reviewed: 07/22/2015 Elsevier Interactive Patient Education  2017 [place] Prevention in the Home Falls can cause injuries. They can happen to people of all ages. There are many things you can do to make your home safe and to help prevent falls. What can I do on the outside of my home?  Regularly fix the edges of walkways and driveways and fix any cracks.  Remove anything that might make you trip as you walk through a door, such as a raised step or threshold.  Trim any bushes or trees on the path to your home.  Use bright outdoor lighting.  Clear any walking paths of anything that might make someone trip, such as rocks or tools.  Regularly check to see if handrails are loose or broken. Make sure that both sides of any steps have handrails.  Any raised decks and porches should have guardrails on the edges.  Have any leaves, snow, or ice cleared regularly.  Use sand or salt on walking paths during winter.  Clean up any spills in your garage right away. This includes oil or grease spills. What can I do in the bathroom?  Use night lights.  Install grab bars by the toilet and in the tub and shower. Do not use towel bars as grab bars.  Use non-skid mats or decals in the tub or shower.  If you need to sit down in the shower, use a plastic, non-slip stool.  Keep the floor dry. Clean up any water that spills on the floor as soon as it  happens.  Remove soap buildup in the tub or shower regularly.  Attach bath mats securely with double-sided non-slip rug tape.  Do not have throw rugs and other things on the floor that can make you trip. What can I do in the bedroom?  Use night lights.  Make sure that you have a light by your bed that is easy to reach.  Do not use any sheets or blankets that are too big for your bed. They should not hang down onto the floor.  Have a firm chair that has side arms. You can use this for support while you get dressed.  Do not have throw rugs and other things on the floor that can make you trip. What can I do in the kitchen?  Clean up any spills right away.  Avoid walking on wet floors.  Keep items that you use a lot in easy-to-reach places.  If you need to reach something above you, use a strong step stool that has a grab bar.  Keep electrical cords out of the way.  Do not use floor polish or wax that makes floors slippery. If you must use wax, use non-skid floor wax.  Do not have  throw rugs and other things on the floor that can make you trip. What can I do with my stairs?  Do not leave any items on the stairs.  Make sure that there are handrails on both sides of the stairs and use them. Fix handrails that are broken or loose. Make sure that handrails are as long as the stairways.  Check any carpeting to make sure that it is firmly attached to the stairs. Fix any carpet that is loose or worn.  Avoid having throw rugs at the top or bottom of the stairs. If you do have throw rugs, attach them to the floor with carpet tape.  Make sure that you have a light switch at the top of the stairs and the bottom of the stairs. If you do not have them, ask someone to add them for you. What else can I do to help prevent falls?  Wear shoes that:  Do not have high heels.  Have rubber bottoms.  Are comfortable and fit you well.  Are closed at the toe. Do not wear sandals.  If you  use a stepladder:  Make sure that it is fully opened. Do not climb a closed stepladder.  Make sure that both sides of the stepladder are locked into place.  Ask someone to hold it for you, if possible.  Clearly mark and make sure that you can see:  Any grab bars or handrails.  First and last steps.  Where the edge of each step is.  Use tools that help you move around (mobility aids) if they are needed. These include:  Canes.  Walkers.  Scooters.  Crutches.  Turn on the lights when you go into a dark area. Replace any light bulbs as soon as they burn out.  Set up your furniture so you have a clear path. Avoid moving your furniture around.  If any of your floors are uneven, fix them.  If there are any pets around you, be aware of where they are.  Review your medicines with your doctor. Some medicines can make you feel dizzy. This can increase your chance of falling. Ask your doctor what other things that you can do to help prevent falls. This information is not intended to replace advice given to you by your health care provider. Make sure you discuss any questions you have with your health care provider. Document Released: 07/17/2009 Document Revised: 02/26/2016 Document Reviewed: 10/25/2014 Elsevier Interactive Patient Education  2017 [employer].

## 2021-03-01 ENCOUNTER — Other Ambulatory Visit: Payer: Self-pay | Admitting: Family Medicine

## 2021-03-01 DIAGNOSIS — Z86711 Personal history of pulmonary embolism: Secondary | ICD-10-CM

## 2021-05-05 ENCOUNTER — Other Ambulatory Visit: Payer: Self-pay | Admitting: Family Medicine

## 2021-05-10 ENCOUNTER — Other Ambulatory Visit (HOSPITAL_COMMUNITY): Payer: Self-pay | Admitting: Cardiology

## 2021-07-22 ENCOUNTER — Other Ambulatory Visit: Payer: Self-pay

## 2021-08-06 ENCOUNTER — Other Ambulatory Visit (HOSPITAL_COMMUNITY): Payer: Self-pay | Admitting: Cardiology

## 2021-08-07 ENCOUNTER — Other Ambulatory Visit: Payer: Self-pay

## 2021-08-07 ENCOUNTER — Encounter: Payer: Self-pay | Admitting: Cardiology

## 2021-08-07 ENCOUNTER — Ambulatory Visit (INDEPENDENT_AMBULATORY_CARE_PROVIDER_SITE_OTHER): Payer: Medicare Other | Admitting: Cardiology

## 2021-08-07 VITALS — BP 94/56 | HR 61 | Ht 69.0 in | Wt 244.1 lb

## 2021-08-07 DIAGNOSIS — I2510 Atherosclerotic heart disease of native coronary artery without angina pectoris: Secondary | ICD-10-CM

## 2021-08-07 DIAGNOSIS — E782 Mixed hyperlipidemia: Secondary | ICD-10-CM

## 2021-08-07 DIAGNOSIS — E559 Vitamin D deficiency, unspecified: Secondary | ICD-10-CM

## 2021-08-07 DIAGNOSIS — E669 Obesity, unspecified: Secondary | ICD-10-CM

## 2021-08-07 DIAGNOSIS — R5383 Other fatigue: Secondary | ICD-10-CM

## 2021-08-07 DIAGNOSIS — N184 Chronic kidney disease, stage 4 (severe): Secondary | ICD-10-CM

## 2021-08-07 DIAGNOSIS — I10 Essential (primary) hypertension: Secondary | ICD-10-CM

## 2021-08-07 MED ORDER — ATORVASTATIN CALCIUM 80 MG PO TABS: 80.0000 mg | ORAL_TABLET | Freq: Every day | ORAL | 3 refills | Status: DC

## 2021-08-07 NOTE — Patient Instructions (Signed)
Medication Instructions:  Your physician recommends that you continue on your current medications as directed. Please refer to the Current Medication list given to you today.  *If you need a refill on your cardiac medications before your next appointment, please call your pharmacy*   Lab Work: Your physician recommends that you return for lab work in: the next few days. You need to have labs done when you are fasting.  You can come Monday through Friday 8:30 am to 12:00 pm and 1:15 to 4:30. You do not need to make an appointment as the order has already been placed. The labs you are going to have done are BMET, CBC, TSH, vitamin D, LFT and Lipids.  If you have labs (blood work) drawn today and your tests are completely normal, you will receive your results only by: [place] (if you have MyChart) OR A paper copy in the mail If you have any lab test that is abnormal or we need to change your treatment, we will call you to review the results.   Testing/Procedures: None ordered   Follow-Up: At [facility], you and your health needs are our priority.  As part of our continuing mission to provide you with exceptional heart care, we have created designated Provider Care Teams.  These Care Teams include your primary Cardiologist (physician) and Advanced Practice Providers (APPs -  Physician Assistants and Nurse Practitioners) who all work together to provide you with the care you need, when you need it.  We recommend signing up for the patient portal called "MyChart".  Sign up information is provided on this After Visit Summary.  MyChart is used to connect with patients for Virtual Visits (Telemedicine).  Patients are able to view lab/test results, encounter notes, upcoming appointments, etc.  Non-urgent messages can be sent to your provider as well.   To learn more about what you can do with MyChart, go to [email or web address].    Your next appointment:   6 month(s)  The format for  your next appointment:   In Person  Provider:   Jyl Heinz, MD   Other Instructions NA

## 2021-08-07 NOTE — Addendum Note (Signed)
Addended by: Roniya Tetro, Jonelle Sidle L on: 08/07/2021 03:34 PM   Modules accepted: Orders

## 2021-08-07 NOTE — Progress Notes (Signed)
Cardiology Office Note:    Date:  08/07/2021   ID:  Thomas Carpenter, DOB [birth year], MRN [id number]  PCP:  Shelda Pal, DO  Cardiologist:  Jenean Lindau, MD   Referring MD: Shelda Pal*    ASSESSMENT:    1. Vitamin D deficiency   2. Coronary artery disease involving native coronary artery of native heart without angina pectoris   3. Mixed dyslipidemia   4. Fatigue, unspecified type   5. Hypertension, unspecified type   6. Obesity (BMI 35.0-39.9 without comorbidity)   7. CKD (chronic kidney disease) stage 4, GFR 15-29 ml/min (HCC)    PLAN:    In order of problems listed above:  Coronary artery disease: Secondary prevention stressed with the patient.  Importance of compliance with diet medication stressed any vocalized understanding.  He was advised to walk at least half an hour a day 5 days a week and he promises to do so. Cardiomyopathy: This has resolved significantly and the patient is on guideline directed medical therapy.  He checks his weight on a regular basis.  Diet was emphasized. Essential hypertension: Blood pressure stable and diet was emphasized.  Blood pressure is borderline but he is asymptomatic and will continue current medications. Mixed dyslipidemia: We will do blood work in the next few days and he will be back for complete blood work. History of pulm embolism on anticoagulation.  Benefits and risks revisited. Patient will be seen in follow-up appointment in 6 months or earlier if the patient has any concerns    Medication Adjustments/Labs and Tests Ordered: Current medicines are reviewed at length with the patient today.  Concerns regarding medicines are outlined above.  Orders Placed This Encounter  Procedures   Basic metabolic panel   CBC with Differential/Platelet   Hepatic function panel   VITAMIN D 25 Hydroxy (Vit-D Deficiency, Fractures)   TSH   Lipid panel   EKG 12-Lead   No orders of the defined types were placed in  this encounter.    No chief complaint on file.    History of Present Illness:    Thomas Carpenter is a 70 y.o. male.  Patient has past medical history of coronary artery disease, stenting, essential hypertension, mixed dyslipidemia, renal insufficiency and history of pulmonary embolism.  Overall he leads a sedentary lifestyle.  He denies any chest pain orthopnea or PND.  At the time of my evaluation, the patient is alert awake oriented and in no distress.  He has history of cardiomyopathy.  Past Medical History:  Diagnosis Date   Actinic keratoses 09/01/2020   Acute ST elevation myocardial infarction (STEMI) due to occlusion of distal portion of left anterior descending (LAD) coronary artery ([place]) 12/14/2019   Acute ST elevation myocardial infarction (STEMI) due to occlusion of left anterior descending (LAD) coronary artery ([place]) 06/01/9370   Acute systolic congestive heart failure (HCC) 6/96/7893   Chronic systolic heart failure ([place]) 01/21/2020   CKD (chronic kidney disease) stage 4, GFR 15-29 ml/min ([place]) 12/13/2019   Coronary artery disease 06/25/2020   DVT (deep venous thrombosis) (HCC)    History of pulmonary embolism 06/25/2020   Hypertension    Mixed dyslipidemia 06/25/2020   Obesity (BMI 35.0-39.9 without comorbidity) 06/25/2020   On intra-aortic balloon pump assist    Pneumonia    Pulmonary emboli [facility])    Pulmonary embolism during current hospitalization ([place]) 12/13/2019   ST elevation myocardial infarction (STEMI) [facility])    Status post coronary artery stent placement  Past Surgical History:  Procedure Laterality Date   CORONARY STENT INTERVENTION N/A 12/13/2019   Procedure: CORONARY STENT INTERVENTION;  Surgeon: Belva Crome, MD;  Location: [place] CV LAB;  Service: Cardiovascular;  Laterality: N/A;   CORONARY/GRAFT ACUTE MI REVASCULARIZATION N/A 12/13/2019   Procedure: Coronary/Graft Acute MI Revascularization;  Surgeon: Belva Crome, MD;  Location: [place] CV LAB;   Service: Cardiovascular;  Laterality: N/A;   IABP INSERTION N/A 12/13/2019   Procedure: IABP Insertion;  Surgeon: Belva Crome, MD;  Location: [place] CV LAB;  Service: Cardiovascular;  Laterality: N/A;   LEFT HEART CATH AND CORONARY ANGIOGRAPHY N/A 12/13/2019   Procedure: LEFT HEART CATH AND CORONARY ANGIOGRAPHY;  Surgeon: Belva Crome, MD;  Location: [place] CV LAB;  Service: Cardiovascular;  Laterality: N/A;    Current Medications: Current Meds  Medication Sig   atorvastatin (LIPITOR) 80 MG tablet TAKE 1 TABLET (80 MG TOTAL) BY MOUTH DAILY AT 6 PM.   Cholecalciferol (VITAMIN D3) 125 MCG (5000 UT) CAPS Take 5,000 Units by mouth daily with breakfast.   clobetasol ointment (TEMOVATE) 1.61 % Apply 1 application topically as needed for rash.   clopidogrel (PLAVIX) 75 MG tablet TAKE 1 TABLET (75 MG TOTAL) BY MOUTH DAILY WITH BREAKFAST.   ENTRESTO 97-103 MG TAKE 1 TABLET BY MOUTH TWICE A DAY   hydrochlorothiazide (HYDRODIURIL) 12.5 MG tablet TAKE 1 TABLET BY MOUTH EVERY DAY   nitroGLYCERIN (NITROSTAT) 0.4 MG SL tablet Place 1 tablet (0.4 mg total) under the tongue every 5 (five) minutes x 3 doses as needed for chest pain.   spironolactone (ALDACTONE) 25 MG tablet TAKE 1/2 TABLET BY MOUTH EVERY DAY   XARELTO 20 MG TABS tablet TAKE 1 TABLET BY MOUTH DAILY WITH SUPPER.     Allergies:   Patient has no known allergies.   Social History   Socioeconomic History   Marital status: Single    Spouse name: Not on file   Number of children: Not on file   Years of education: Not on file   Highest education level: Not on file  Occupational History   Occupation: retired  Tobacco Use   Smoking status: Never   Smokeless tobacco: Current    Types: Chew  Vaping Use   Vaping Use: Never used  Substance and Sexual Activity   Alcohol use: Yes    Comment: daily   Drug use: Never   Sexual activity: Not on file  Other Topics Concern   Not on file  Social History Narrative   Not on file    Social Determinants of Health   Financial Resource Strain: Low Risk    Difficulty of Paying Living Expenses: Not hard at all  Food Insecurity: No Food Insecurity   Worried About Charity fundraiser in the Last Year: Never true   [place] in the Last Year: Never true  Transportation Needs: No Transportation Needs   Lack of Transportation (Medical): No   Lack of Transportation (Non-Medical): No  Physical Activity: Sufficiently Active   Days of Exercise per Week: 5 days   Minutes of Exercise per Session: 30 min  Stress: No Stress Concern Present   Feeling of Stress : Not at all  Social Connections: Socially Isolated   Frequency of Communication with Friends and Family: More than three times a week   Frequency of Social Gatherings with Friends and Family: More than three times a week   Attends Religious Services: Never   Active  Member of Clubs or Organizations: No   Attends Music therapist: Never   Marital Status: Divorced     Family History: The patient's family history includes Cancer in his mother; Heart attack in his paternal uncle; Hypertension in his father; Kidney disease in his mother. There is no history of Diabetes or Heart disease.  ROS:   Please see the history of present illness.    All other systems reviewed and are negative.  EKGs/Labs/Other Studies Reviewed:    The following studies were reviewed today: I discussed my findings with the patient at length.  EKG reveals anterior and inferior wall myocardial infarction of undetermined age.   Recent Labs: 09/23/2020: BUN 22; Creatinine, Ser 1.45; Potassium 4.4; Sodium 132  Recent Lipid Panel    Component Value Date/Time   CHOL 90 (L) 06/30/2020 1019   TRIG 45 06/30/2020 1019   HDL 44 06/30/2020 1019   CHOLHDL 2.0 06/30/2020 1019   CHOLHDL 4.0 01/09/2020 1416   VLDL 25 01/09/2020 1416   LDLCALC 34 06/30/2020 1019    Physical Exam:    VS:  BP (!) 94/56   Pulse 61   Ht 5\' 9"  (1.753  m)   Wt 244 lb 1.3 oz (110.7 kg)   SpO2 95%   BMI 36.04 kg/m     Wt Readings from Last 3 Encounters:  08/07/21 244 lb 1.3 oz (110.7 kg)  02/26/21 246 lb 12.8 oz (111.9 kg)  10/30/20 246 lb (111.6 kg)     GEN: Patient is in no acute distress HEENT: Normal NECK: No JVD; No carotid bruits LYMPHATICS: No lymphadenopathy CARDIAC: Hear sounds regular, 2/6 systolic murmur at the apex. RESPIRATORY:  Clear to auscultation without rales, wheezing or rhonchi  ABDOMEN: Soft, non-tender, non-distended MUSCULOSKELETAL:  No edema; No deformity  SKIN: Warm and dry NEUROLOGIC:  Alert and oriented x 3 PSYCHIATRIC:  Normal affect   Signed, Jenean Lindau, MD  08/07/2021 2:28 PM    [place]

## 2021-08-15 LAB — LIPID PANEL
Chol/HDL Ratio: 2.4 ratio (ref 0.0–5.0)
Cholesterol, Total: 113 mg/dL (ref 100–199)
HDL: 48 mg/dL (ref >39)
LDL Chol Calc (NIH): 53 mg/dL (ref 0–99)
Triglycerides: 48 mg/dL (ref 0–149)
VLDL Cholesterol Cal: 12 mg/dL (ref 5–40)

## 2021-08-15 LAB — BASIC METABOLIC PANEL
BUN/Creatinine Ratio: 16 (ref 10–24)
BUN: 21 mg/dL (ref 8–27)
CO2: 20 mmol/L (ref 20–29)
Calcium: 9.1 mg/dL (ref 8.6–10.2)
Chloride: 107 mmol/L — ABNORMAL HIGH (ref 96–106)
Creatinine, Ser: 1.28 mg/dL — ABNORMAL HIGH (ref 0.76–1.27)
Glucose: 101 mg/dL — ABNORMAL HIGH (ref 70–99)
Potassium: 5 mmol/L (ref 3.5–5.2)
Sodium: 140 mmol/L (ref 134–144)
eGFR: 61 mL/min/{1.73_m2} (ref >59)

## 2021-08-15 LAB — CBC WITH DIFFERENTIAL/PLATELET
Basophils Absolute: 0 10*3/uL (ref 0.0–0.2)
Basos: 1 %
EOS (ABSOLUTE): 0.5 10*3/uL — ABNORMAL HIGH (ref 0.0–0.4)
Eos: 8 %
Hematocrit: 45.4 % (ref 37.5–51.0)
Hemoglobin: 15.3 g/dL (ref 13.0–17.7)
Immature Grans (Abs): 0 10*3/uL (ref 0.0–0.1)
Immature Granulocytes: 0 %
Lymphocytes Absolute: 1.1 10*3/uL (ref 0.7–3.1)
Lymphs: 21 %
MCH: 33.6 pg — ABNORMAL HIGH (ref 26.6–33.0)
MCHC: 33.7 g/dL (ref 31.5–35.7)
MCV: 100 fL — ABNORMAL HIGH (ref 79–97)
Monocytes Absolute: 0.7 10*3/uL (ref 0.1–0.9)
Monocytes: 12 %
Neutrophils Absolute: 3.2 10*3/uL (ref 1.4–7.0)
Neutrophils: 58 %
Platelets: 250 10*3/uL (ref 150–450)
RBC: 4.56 x10E6/uL (ref 4.14–5.80)
RDW: 12.6 % (ref 11.6–15.4)
WBC: 5.5 10*3/uL (ref 3.4–10.8)

## 2021-08-15 LAB — HEPATIC FUNCTION PANEL
ALT: 18 IU/L (ref 0–44)
AST: 8 IU/L (ref 0–40)
Albumin: 4 g/dL (ref 3.8–4.8)
Alkaline Phosphatase: 75 IU/L (ref 44–121)
Bilirubin Total: 0.3 mg/dL (ref 0.0–1.2)
Bilirubin, Direct: 0.14 mg/dL (ref 0.00–0.40)
Total Protein: 6.2 g/dL (ref 6.0–8.5)

## 2021-08-15 LAB — TSH: TSH: 4.69 u[IU]/mL — ABNORMAL HIGH (ref 0.450–4.500)

## 2021-08-15 LAB — VITAMIN D 25 HYDROXY (VIT D DEFICIENCY, FRACTURES): Vit D, 25-Hydroxy: 48.7 ng/mL (ref 30.0–100.0)

## 2021-08-18 ENCOUNTER — Encounter: Payer: Self-pay | Admitting: Family Medicine

## 2021-08-18 ENCOUNTER — Ambulatory Visit (INDEPENDENT_AMBULATORY_CARE_PROVIDER_SITE_OTHER): Payer: Medicare Other | Admitting: Family Medicine

## 2021-08-18 ENCOUNTER — Other Ambulatory Visit: Payer: Self-pay

## 2021-08-18 VITALS — BP 108/62 | HR 56 | Temp 98.2°F | Ht 69.0 in | Wt 251.0 lb

## 2021-08-18 DIAGNOSIS — R635 Abnormal weight gain: Secondary | ICD-10-CM

## 2021-08-18 DIAGNOSIS — L570 Actinic keratosis: Secondary | ICD-10-CM

## 2021-08-18 DIAGNOSIS — R7989 Other specified abnormal findings of blood chemistry: Secondary | ICD-10-CM

## 2021-08-18 NOTE — Patient Instructions (Addendum)
Give [place] 2-3 business days to get the results of your labs back.   If you do not hear anything about your referral in the next 1-2 weeks, call our office and ask for an update.  Let us know if you need anything.

## 2021-08-18 NOTE — Progress Notes (Signed)
Chief Complaint  Patient presents with   Discuss lab work for his thyroid    Subjective: Patient is a 70 y.o. male here for f/u.  Patient had labs drawn showing an elevated TSH with his cardiology team.  He has gained a little weight but denies fatigue, palpitations, new swelling, bowel changes, or new skin changes.  He does have history of eczema.  I took a lesion off his ear nearly 1 year ago.  It showed actinic keratosis.  A couple weeks ago he noticed some rough portions coming back.  He has other rough areas on his neck and head.  He does not follow with a dermatologist.  Past Medical History:  Diagnosis Date   Actinic keratoses 09/01/2020   Acute ST elevation myocardial infarction (STEMI) due to occlusion of distal portion of left anterior descending (LAD) coronary artery ([place]) 12/14/2019   Acute ST elevation myocardial infarction (STEMI) due to occlusion of left anterior descending (LAD) coronary artery ([place]) 1/85/6314   Acute systolic congestive heart failure (HCC) 9/70/2637   Chronic systolic heart failure ([place]) 01/21/2020   CKD (chronic kidney disease) stage 4, GFR 15-29 ml/min ([place]) 12/13/2019   Coronary artery disease 06/25/2020   DVT (deep venous thrombosis) (HCC)    History of pulmonary embolism 06/25/2020   Hypertension    Mixed dyslipidemia 06/25/2020   Obesity (BMI 35.0-39.9 without comorbidity) 06/25/2020   On intra-aortic balloon pump assist    Pneumonia    Pulmonary emboli (HCC)    Pulmonary embolism during current hospitalization ([place]) 12/13/2019   ST elevation myocardial infarction (STEMI) ([place])    Status post coronary artery stent placement     Objective: BP 108/62   Pulse (!) 56   Temp 98.2 F (36.8 C) (Oral)   Ht 5\' 9"  (1.753 m)   Wt 251 lb (113.9 kg)   SpO2 99%   BMI 37.07 kg/m  General: Awake, appears stated age Heart: Bradycardic, regular rhythm, 1+ pitting bilateral LE edema Abdomen: Bowel sounds present, soft, nontender, nondistended Lungs: CTAB,  no rales, wheezes or rhonchi. No accessory muscle use Skin: Scaly patches with erythematous bases noted on the face, neck, ear, and scalp Psych: Age appropriate judgment and insight, normal affect and mood  Procedure note: cryotherapy Verbal consent obtained 14 skin lesions treated Liquid nitrogen was applied via a thin spray creating an ice ball with 1-2 mm corona surrounding the lesion The patient tolerated the procedure well There were no immediate complications noted   Assessment and Plan: Elevated TSH - Plan: TSH, T4, free  Weight gain - Plan: TSH, T4, free  Actinic keratoses - Plan: Ambulatory referral to Dermatology, PR DESTRUC PREMAL,FIRST LESION, PR DESTRUC BENIGN/PREMAL,2-14 LESIONS  1/2. New dx, uncertain prog, workup planned. Gained 5 lbs and had elevated TSH. Will follow up with labs as above. 3. Chronic, unstable. Cryo today. I would like him to see derm for routine skin checks as I do not want to miss anything and he is fairer skinned.  The patient voiced understanding and agreement to the plan.  [place], DO 08/18/21  4:29 PM

## 2021-08-19 LAB — T4, FREE: Free T4: 0.65 ng/dL (ref 0.60–1.60)

## 2021-08-19 LAB — TSH: TSH: 3.47 u[IU]/mL (ref 0.35–5.50)

## 2021-09-01 ENCOUNTER — Other Ambulatory Visit: Payer: Self-pay | Admitting: Family Medicine

## 2021-09-01 DIAGNOSIS — Z86711 Personal history of pulmonary embolism: Secondary | ICD-10-CM

## 2021-09-03 IMAGING — DX DG CHEST 1V PORT
1 series · 1 of 1 positions shown · non-contrast
Comparison: 12/14/2019

CLINICAL DATA: Intra-aortic balloon pump.  Follow-up exam.

EXAM:
PORTABLE CHEST 1 VIEW

[chest ap]
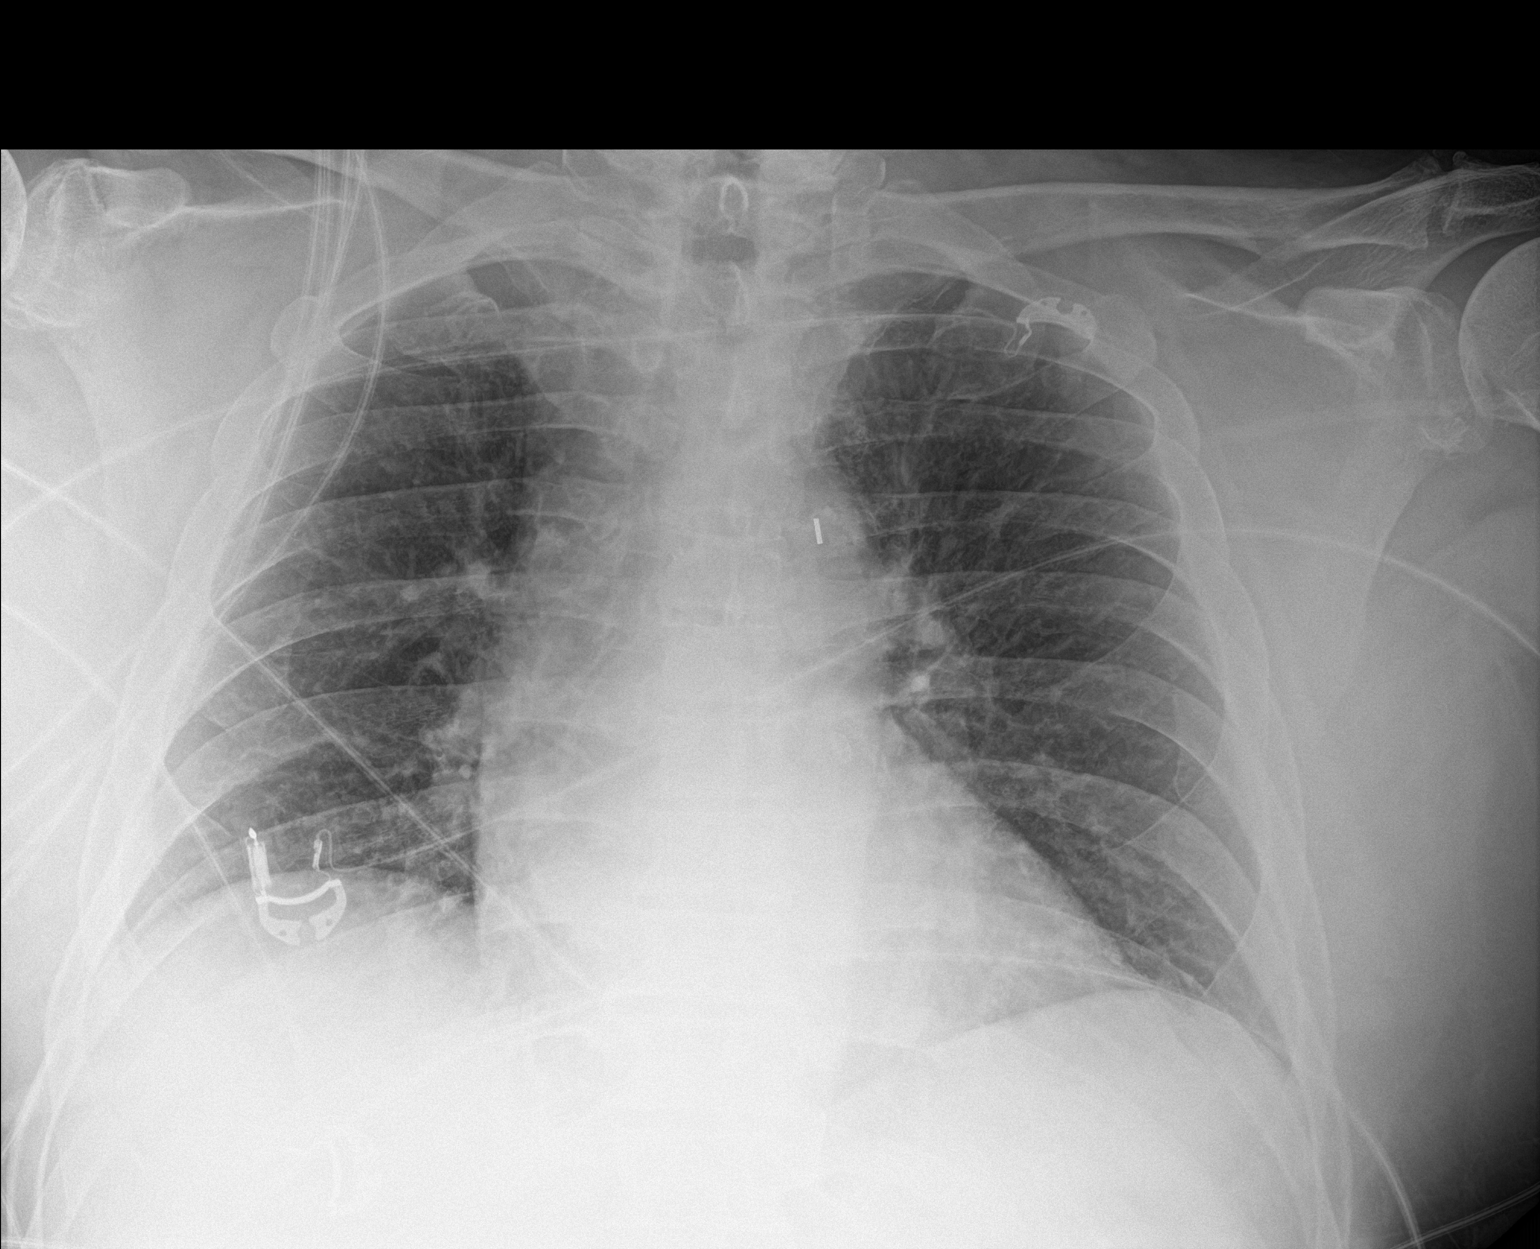

[1 of 1 positions shown; findings below may reference images not displayed]

FINDINGS: Metal tip of the intra-aortic balloon pump projects over the aortic
knob, well positioned.

Cardiac silhouette is normal in size. No mediastinal or hilar
masses.

Lungs show prominent bronchovascular markings, but are otherwise
clear.

No visualized pleural effusion and no pneumothorax.
IMPRESSION: 1. Well-positioned intra-aortic balloon pump.
2. No acute cardiopulmonary disease.

## 2021-09-04 IMAGING — DX DG CHEST 1V PORT
1 series · 1 of 1 positions shown · non-contrast
Comparison: 12/15/2019

CLINICAL DATA: Heart failure.  Follow-up exam.

EXAM:
PORTABLE CHEST 1 VIEW

[chest]
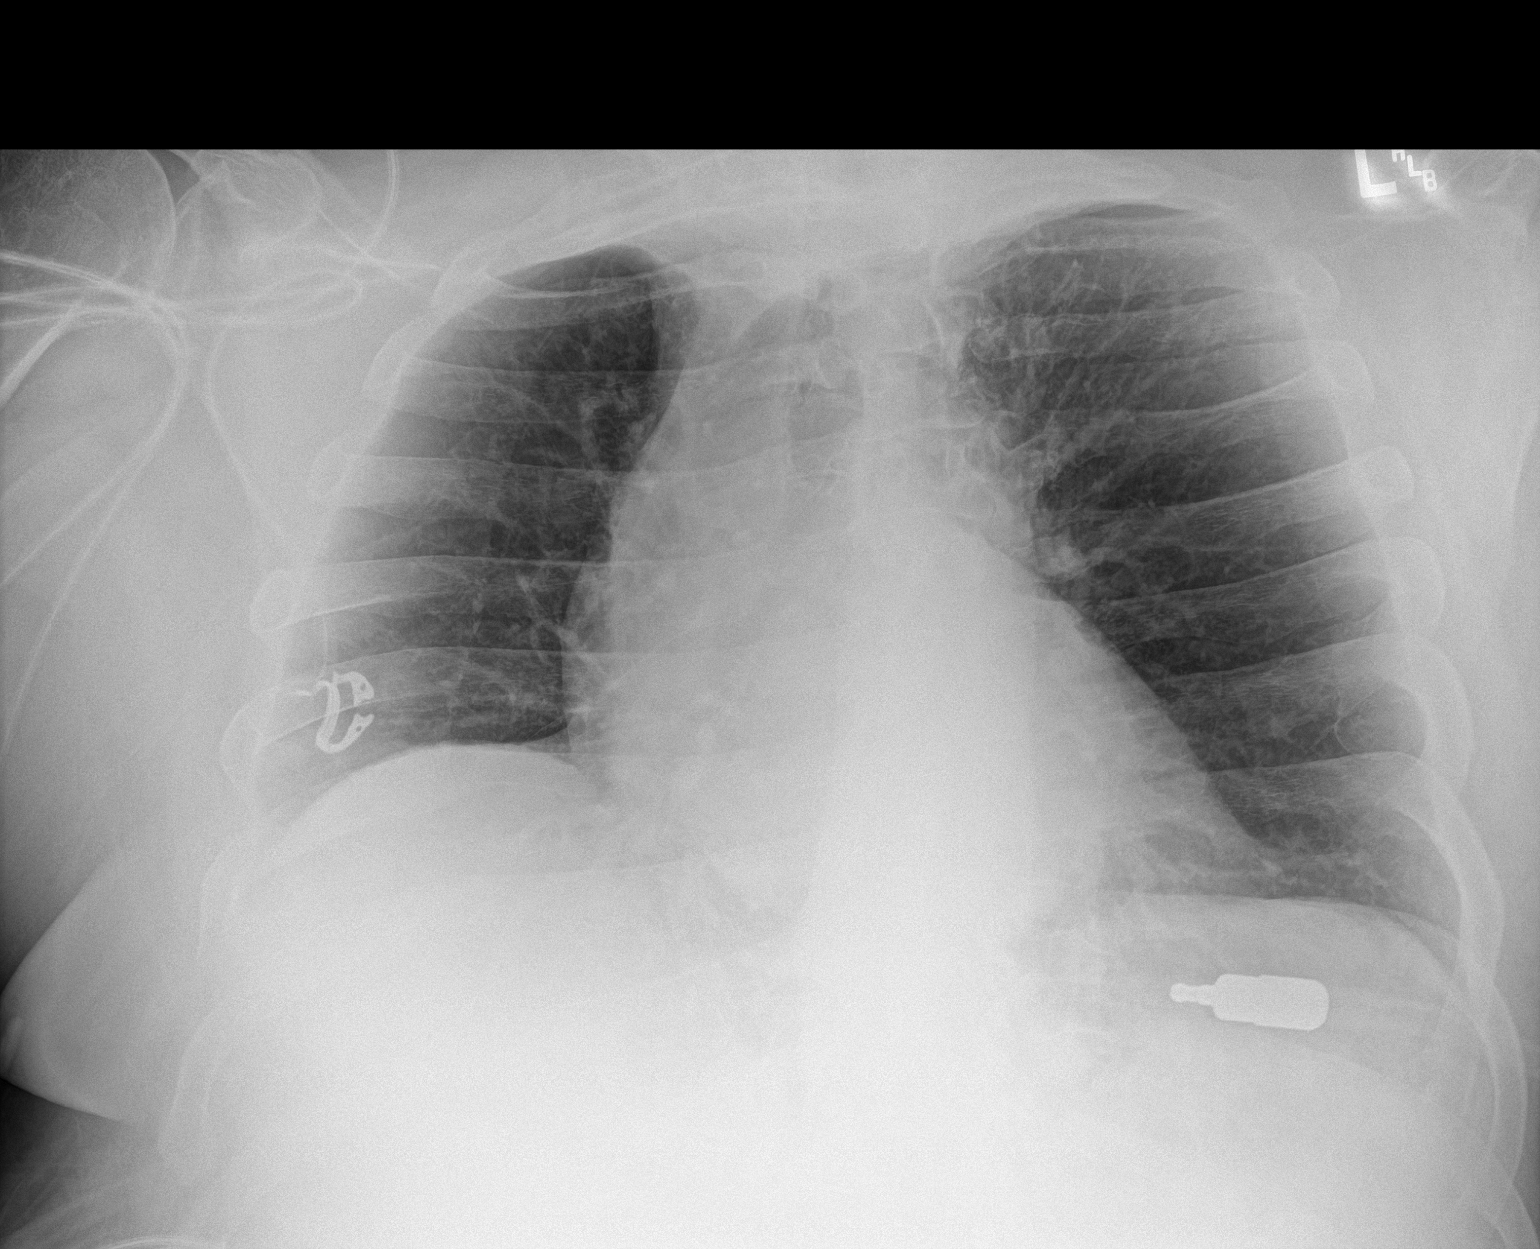

[1 of 1 positions shown; findings below may reference images not displayed]

FINDINGS: Cardiac silhouette is normal in size. No mediastinal or hilar
masses.

Intra-aortic balloon pump has been removed.

Lungs are clear.  No convincing pleural effusion.  No pneumothorax.
IMPRESSION: 1. No acute cardiopulmonary disease.

## 2021-09-11 ENCOUNTER — Other Ambulatory Visit: Payer: Self-pay | Admitting: Family Medicine

## 2021-11-06 ENCOUNTER — Other Ambulatory Visit (HOSPITAL_COMMUNITY): Payer: Self-pay | Admitting: Cardiology

## 2021-11-10 ENCOUNTER — Other Ambulatory Visit: Payer: Self-pay | Admitting: Cardiology

## 2022-01-06 ENCOUNTER — Other Ambulatory Visit: Payer: Self-pay | Admitting: Family Medicine

## 2022-02-03 ENCOUNTER — Other Ambulatory Visit (HOSPITAL_COMMUNITY): Payer: Self-pay | Admitting: Family Medicine

## 2022-02-18 ENCOUNTER — Ambulatory Visit: Payer: Medicare Other | Admitting: Cardiology

## 2022-03-03 ENCOUNTER — Other Ambulatory Visit: Payer: Self-pay | Admitting: Family Medicine

## 2022-03-03 DIAGNOSIS — Z86711 Personal history of pulmonary embolism: Secondary | ICD-10-CM

## 2022-03-04 ENCOUNTER — Ambulatory Visit (INDEPENDENT_AMBULATORY_CARE_PROVIDER_SITE_OTHER): Payer: Medicare Other

## 2022-03-04 ENCOUNTER — Ambulatory Visit: Payer: Medicare Other

## 2022-03-04 VITALS — BP 113/73 | HR 55 | Temp 98.7°F | Resp 16 | Ht 69.0 in | Wt 245.6 lb

## 2022-03-04 DIAGNOSIS — Z0000 Encounter for general adult medical examination without abnormal findings: Secondary | ICD-10-CM | POA: Diagnosis not present

## 2022-03-04 NOTE — Progress Notes (Signed)
Subjective:   Thomas Carpenter is a 71 y.o. male who presents for Medicare Annual/Subsequent preventive examination.  Review of Systems     Cardiac Risk Factors include: hypertension;dyslipidemia;male gender;advanced age (>39mn, >>50women)     Objective:    Today's Vitals   03/04/22 1351  BP: 113/73  Pulse: (!) 55  Resp: 16  Temp: 98.7 F (37.1 C)  SpO2: 98%  Weight: 245 lb 9.6 oz (111.4 kg)  Height: '5\' 9"'$  (1.753 m)   Body mass index is 36.27 kg/m.     03/04/2022    1:49 PM 02/26/2021    1:23 PM 12/14/2019    1:00 AM 12/13/2019    9:16 PM  Advanced Directives  Does Patient Have a Medical Advance Directive? No Yes  No  Type of ACorporate treasurerof [place] will    Copy of [place]?  No - copy requested    Would patient like information on creating a medical advance directive? No - Patient declined  No - Patient declined     Current Medications (verified) Outpatient Encounter Medications as of 03/04/2022  Medication Sig   atorvastatin (LIPITOR) 80 MG tablet Take 1 tablet (80 mg total) by mouth daily at 6 PM.   Cholecalciferol (VITAMIN D3) 125 MCG (5000 UT) CAPS Take 5,000 Units by mouth daily with breakfast.   clobetasol ointment (TEMOVATE) 09.47% Apply 1 application topically as needed for rash.   ENTRESTO 97-103 MG TAKE 1 TABLET BY MOUTH TWICE A DAY   hydrochlorothiazide (HYDRODIURIL) 12.5 MG tablet TAKE 1 TABLET BY MOUTH EVERY DAY   nitroGLYCERIN (NITROSTAT) 0.4 MG SL tablet Place 1 tablet (0.4 mg total) under the tongue every 5 (five) minutes x 3 doses as needed for chest pain.   spironolactone (ALDACTONE) 25 MG tablet Take 0.5 tablets (12.5 mg total) by mouth daily.   XARELTO 20 MG TABS tablet TAKE 1 TABLET BY MOUTH DAILY WITH SUPPER   [DISCONTINUED] clopidogrel (PLAVIX) 75 MG tablet TAKE 1 TABLET (75 MG TOTAL) BY MOUTH DAILY WITH BREAKFAST.   No facility-administered encounter medications on file as of 03/04/2022.     Allergies (verified) Patient has no known allergies.   History: Past Medical History:  Diagnosis Date   Actinic keratoses 09/01/2020   Acute ST elevation myocardial infarction (STEMI) due to occlusion of distal portion of left anterior descending (LAD) coronary artery ([facility] 12/14/2019   Acute ST elevation myocardial infarction (STEMI) due to occlusion of left anterior descending (LAD) coronary artery ([facility] 36/54/6503  Acute systolic congestive heart failure (HCC) 35/46/5681  Chronic systolic heart failure (HCC) 01/21/2020   CKD (chronic kidney disease) stage 4, GFR 15-29 ml/min ([facility] 12/13/2019   Coronary artery disease 06/25/2020   DVT (deep venous thrombosis) (HCC)    History of pulmonary embolism 06/25/2020   Hypertension    Mixed dyslipidemia 06/25/2020   Obesity (BMI 35.0-39.9 without comorbidity) 06/25/2020   On intra-aortic balloon pump assist    Pneumonia    Pulmonary emboli ([facility]    Pulmonary embolism during current hospitalization ([facility] 12/13/2019   ST elevation myocardial infarction (STEMI) ([facility]    Status post coronary artery stent placement    Past Surgical History:  Procedure Laterality Date   CORONARY STENT INTERVENTION N/A 12/13/2019   Procedure: CORONARY STENT INTERVENTION;  Surgeon: SBelva Crome MD;  [facility];  Service: Cardiovascular;  Laterality: N/A;   CORONARY/GRAFT ACUTE MI REVASCULARIZATION N/A 12/13/2019   Procedure:  Coronary/Graft Acute MI Revascularization;  Surgeon: Belva Crome, MD;  Location: [place] [facility];  Service: Cardiovascular;  Laterality: N/A;   IABP INSERTION N/A 12/13/2019   Procedure: IABP Insertion;  Surgeon: Belva Crome, MD;  Location: [place] [facility];  Service: Cardiovascular;  Laterality: N/A;   LEFT HEART CATH AND CORONARY ANGIOGRAPHY N/A 12/13/2019   Procedure: LEFT HEART CATH AND CORONARY ANGIOGRAPHY;  Surgeon: Belva Crome, MD;  Location: [place] [facility];  Service: Cardiovascular;  Laterality: N/A;    Family History  Problem Relation Age of Onset   Kidney disease Mother    Cancer Mother    Hypertension Father    Heart attack Paternal Uncle    Diabetes Neg Hx    Heart disease Neg Hx    Social History   Socioeconomic History   Marital status: Single    Spouse name: Not on file   Number of children: Not on file   Years of education: Not on file   Highest education level: Not on file  Occupational History   Occupation: retired  Tobacco Use   Smoking status: Never   Smokeless tobacco: Current    Types: Database administrator Use   Vaping Use: Never used  Substance and Sexual Activity   Alcohol use: Yes    Comment: daily   Drug use: Never   Sexual activity: Not on file  Other Topics Concern   Not on file  Social History Narrative   Not on file   Social Determinants of Health   Financial Resource Strain: Low Risk    Difficulty of Paying Living Expenses: Not hard at all  Food Insecurity: No Food Insecurity   Worried About Charity fundraiser in the Last Year: Never true   [place] in the Last Year: Never true  Transportation Needs: No Transportation Needs   Lack of Transportation (Medical): No   Lack of Transportation (Non-Medical): No  Physical Activity: Sufficiently Active   Days of Exercise per Week: 7 days   Minutes of Exercise per Session: 60 min  Stress: No Stress Concern Present   Feeling of Stress : Not at all  Social Connections: Socially Isolated   Frequency of Communication with Friends and Family: More than three times a week   Frequency of Social Gatherings with Friends and Family: More than three times a week   Attends Religious Services: Never   Marine scientist or Organizations: No   Attends Music therapist: Never   Marital Status: Divorced    Tobacco Counseling Ready to quit: Not Answered Counseling given: Not Answered   Clinical Intake:  Pre-visit preparation completed: Yes  Pain : No/denies pain     Nutritional  Risks: None Diabetes: No  How often do you need to have someone help you when you read instructions, pamphlets, or other written materials from your doctor or pharmacy?: 1 - Never  Diabetic?No  Interpreter Needed?: No  Information entered by :: [place] [facility]    03/04/2022    1:54 PM  In your present state of health, do you have any difficulty performing the following activities:  Hearing? 0  Vision? 0  Difficulty concentrating or making decisions? 0  Walking or climbing stairs? 0  Dressing or bathing? 0  Doing errands, shopping? 0  Preparing Food and eating ? N  Using the Toilet? N  In the past six months, have you accidently leaked urine?  N  Do you have problems with loss of bowel control? N  Managing your Medications? N  Managing your Finances? N  Housekeeping or managing your Housekeeping? N    Patient Care Team: Shelda Pal, DO as PCP - General (Family Medicine) Bensimhon, Shaune Pascal, MD as PCP - Advanced Heart Failure (Cardiology)  Indicate any recent Medical Services you may have received from other than [facility] providers in the past year (date may be approximate).     Assessment:   This is a routine wellness examination for [place].  Hearing/Vision screen No results found.  Dietary issues and exercise activities discussed: Current Exercise Habits: Home exercise routine, Type of exercise: walking;Other - see comments;stretching (bikng), Time (Minutes): 60, Frequency (Times/Week): 7, Weekly Exercise (Minutes/Week): 420, Intensity: Mild   Goals Addressed             This Visit's Progress    Patient Stated   On track    Maintain current health       Depression Screen    03/04/2022    1:52 PM 02/26/2021    1:25 PM  PHQ 2/9 Scores  PHQ - 2 Score 0 0    Fall Risk    03/04/2022    1:52 PM 02/26/2021    1:24 PM  [place] in the past year? 0 0  Number falls in past yr: 0 0  Injury with Fall? 0 0  Risk for  fall due to : No Fall Risks   Follow up Falls evaluation completed Falls prevention discussed    [place]:  Any stairs in or around the home? No  If so, are there any without handrails?  N/A Home free of loose throw rugs in walkways, pet beds, electrical cords, etc? Yes  Adequate lighting in your home to reduce risk of falls? Yes   ASSISTIVE DEVICES UTILIZED TO PREVENT FALLS:  Life alert? No  Use of a cane, walker or w/c? No  Grab bars in the bathroom? No  Shower chair or bench in shower? No  Elevated toilet seat or a handicapped toilet? No   TIMED UP AND GO:  Was the test performed? Yes .  Length of time to ambulate 10 feet: 10 sec.   Gait steady and fast without use of assistive device  Cognitive Function:        03/04/2022    1:56 PM  6CIT Screen  What Year? 0 points  What month? 0 points  What time? 0 points  Count back from 20 0 points  Months in reverse 0 points  Repeat phrase 0 points  Total Score 0 points    Immunizations Immunization History  Administered Date(s) Administered   Influenza, High Dose Seasonal PF 07/02/2020   Influenza-Unspecified 08/11/2021   PFIZER(Purple Top)SARS-COV-2 Vaccination 11/08/2019, 11/29/2019, 09/01/2020   Pfizer Covid-19 Vaccine Bivalent Booster 40yr & up 09/11/2021    TDAP status: Due, Education has been provided regarding the importance of this vaccine. Advised may receive this vaccine at local pharmacy or Health Dept. Aware to provide a copy of the vaccination record if obtained from local pharmacy or Health Dept. Verbalized acceptance and understanding.  Flu Vaccine status: Up to date  Pneumococcal vaccine status: Declined,  Education has been provided regarding the importance of this vaccine but patient still declined. Advised may receive this vaccine at local pharmacy or Health Dept. Aware to provide a copy of the vaccination record if obtained from local pharmacy  or Health Dept.  Verbalized acceptance and understanding.   Covid-19 vaccine status: Completed vaccines  Qualifies for Shingles Vaccine? Yes   Zostavax completed No   Shingrix Completed?: No.    Education has been provided regarding the importance of this vaccine. Patient has been advised to call insurance company to determine out of pocket expense if they have not yet received this vaccine. Advised may also receive vaccine at local pharmacy or Health Dept. Verbalized acceptance and understanding.  Screening Tests Health Maintenance  Topic Date Due   Hepatitis C Screening  Never done   Zoster Vaccines- Shingrix (1 of 2) Never done   COLONOSCOPY (Pts 45-70yr Insurance coverage will need to be confirmed)  Never done   Pneumonia Vaccine 70 Years old (1 - PCV) Never done   INFLUENZA VACCINE  05/04/2022   COVID-19 Vaccine  Completed   HPV VACCINES  Aged Out   TETANUS/TDAP  Discontinued    Health Maintenance  Health Maintenance Due  Topic Date Due   Hepatitis C Screening  Never done   Zoster Vaccines- Shingrix (1 of 2) Never done   COLONOSCOPY (Pts 45-484yrInsurance coverage will need to be confirmed)  Never done   Pneumonia Vaccine 6536Years old (1 - PCV) Never done    Colorectal cancer screening: Type of screening: Colonoscopy. Completed 2016. Repeat every 10 years  Lung Cancer Screening: (Low Dose CT Chest recommended if Age 70-80ears, 30 pack-year currently smoking OR have quit w/in 15years.) does not qualify.   Lung Cancer Screening Referral: N/A  Additional Screening:  Hepatitis C Screening: does qualify; Completed not yet  Vision Screening: Recommended annual ophthalmology exams for early detection of glaucoma and other disorders of the eye. Is the patient up to date with their annual eye exam?  Yes  Who is the provider or what is the name of the office in which the patient attends annual eye exams? N/A If pt is not established with a provider, would they like to be referred to a  provider to establish care? No .   Dental Screening: Recommended annual dental exams for proper oral hygiene  Community Resource Referral / Chronic Care Management: CRR required this visit?  No   CCM required this visit?  No      Plan:     I have personally reviewed and noted the following in the patient's chart:   Medical and social history Use of alcohol, tobacco or illicit drugs  Current medications and supplements including opioid prescriptions. Patient is not currently taking opioid prescriptions. Functional ability and status Nutritional status Physical activity Advanced directives List of other physicians Hospitalizations, surgeries, and ER visits in previous 12 months Vitals Screenings to include cognitive, depression, and falls Referrals and appointments  In addition, I have reviewed and discussed with patient certain preventive protocols, quality metrics, and best practice recommendations. A written personalized care plan for preventive services as well as general preventive health recommendations were provided to patient.     ShDuard Bradyhism, CMA   03/04/2022   Nurse Notes: none

## 2022-03-04 NOTE — Patient Instructions (Signed)
Thomas Carpenter , Thank you for taking time to come for your Medicare Wellness Visit. I appreciate your ongoing commitment to your health goals. Please review the following plan we discussed and let me know if I can assist you in the future.   Screening recommendations/referrals: Colonoscopy: 2016 due 2026 Recommended yearly ophthalmology/optometry visit for glaucoma screening and checkup Recommended yearly dental visit for hygiene and checkup  Vaccinations: Influenza vaccine: up to date Pneumococcal vaccine: Due-May obtain vaccine at our office or your local pharmacy.  Tdap vaccine: Due-May obtain vaccine at our office or your local pharmacy.  Shingles vaccine: Due-May obtain vaccine at our office or your local pharmacy.    Covid-19: completed  Advanced directives: no  Conditions/risks identified: see problem list   Next appointment: Follow up in one year for your annual wellness visit. 03/08/23  Preventive Care 70 Years and Older, Male Preventive care refers to lifestyle choices and visits with your health care provider that can promote health and wellness. What does preventive care include? A yearly physical exam. This is also called an annual well check. Dental exams once or twice a year. Routine eye exams. Ask your health care provider how often you should have your eyes checked. Personal lifestyle choices, including: Daily care of your teeth and gums. Regular physical activity. Eating a healthy diet. Avoiding tobacco and drug use. Limiting alcohol use. Practicing safe sex. Taking low doses of aspirin every day. Taking vitamin and mineral supplements as recommended by your health care provider. What happens during an annual well check? The services and screenings done by your health care provider during your annual well check will depend on your age, overall health, lifestyle risk factors, and family history of disease. Counseling  Your health care provider may ask you questions  about your: Alcohol use. Tobacco use. Drug use. Emotional well-being. Home and relationship well-being. Sexual activity. Eating habits. History of falls. Memory and ability to understand (cognition). Work and work Statistician. Screening  You may have the following tests or measurements: Height, weight, and BMI. Blood pressure. Lipid and cholesterol levels. These may be checked every 5 years, or more frequently if you are over 70 years old. Skin check. Lung cancer screening. You may have this screening every year starting at age 70 if you have a 30-pack-year history of smoking and currently smoke or have quit within the past 15 years. Fecal occult blood test (FOBT) of the stool. You may have this test every year starting at age 70. Flexible sigmoidoscopy or colonoscopy. You may have a sigmoidoscopy every 5 years or a colonoscopy every 10 years starting at age 70. Prostate cancer screening. Recommendations will vary depending on your family history and other risks. Hepatitis C blood test. Hepatitis B blood test. Sexually transmitted disease (STD) testing. Diabetes screening. This is done by checking your blood sugar (glucose) after you have not eaten for a while (fasting). You may have this done every 1-3 years. Abdominal aortic aneurysm (AAA) screening. You may need this if you are a current or former smoker. Osteoporosis. You may be screened starting at age 35 if you are at high risk. Talk with your health care provider about your test results, treatment options, and if necessary, the need for more tests. Vaccines  Your health care provider may recommend certain vaccines, such as: Influenza vaccine. This is recommended every year. Tetanus, diphtheria, and acellular pertussis (Tdap, Td) vaccine. You may need a Td booster every 10 years. Zoster vaccine. You may need this after  age 70. Pneumococcal 13-valent conjugate (PCV13) vaccine. One dose is recommended after age 70. Pneumococcal  polysaccharide (PPSV23) vaccine. One dose is recommended after age 70. Talk to your health care provider about which screenings and vaccines you need and how often you need them. This information is not intended to replace advice given to you by your health care provider. Make sure you discuss any questions you have with your health care provider. Document Released: 10/17/2015 Document Revised: 06/09/2016 Document Reviewed: 07/22/2015 Elsevier Interactive Patient Education  2017 [place] Prevention in the Home Falls can cause injuries. They can happen to people of all ages. There are many things you can do to make your home safe and to help prevent falls. What can I do on the outside of my home? Regularly fix the edges of walkways and driveways and fix any cracks. Remove anything that might make you trip as you walk through a door, such as a raised step or threshold. Trim any bushes or trees on the path to your home. Use bright outdoor lighting. Clear any walking paths of anything that might make someone trip, such as rocks or tools. Regularly check to see if handrails are loose or broken. Make sure that both sides of any steps have handrails. Any raised decks and porches should have guardrails on the edges. Have any leaves, snow, or ice cleared regularly. Use sand or salt on walking paths during winter. Clean up any spills in your garage right away. This includes oil or grease spills. What can I do in the bathroom? Use night lights. Install grab bars by the toilet and in the tub and shower. Do not use towel bars as grab bars. Use non-skid mats or decals in the tub or shower. If you need to sit down in the shower, use a plastic, non-slip stool. Keep the floor dry. Clean up any water that spills on the floor as soon as it happens. Remove soap buildup in the tub or shower regularly. Attach bath mats securely with double-sided non-slip rug tape. Do not have throw rugs and other  things on the floor that can make you trip. What can I do in the bedroom? Use night lights. Make sure that you have a light by your bed that is easy to reach. Do not use any sheets or blankets that are too big for your bed. They should not hang down onto the floor. Have a firm chair that has side arms. You can use this for support while you get dressed. Do not have throw rugs and other things on the floor that can make you trip. What can I do in the kitchen? Clean up any spills right away. Avoid walking on wet floors. Keep items that you use a lot in easy-to-reach places. If you need to reach something above you, use a strong step stool that has a grab bar. Keep electrical cords out of the way. Do not use floor polish or wax that makes floors slippery. If you must use wax, use non-skid floor wax. Do not have throw rugs and other things on the floor that can make you trip. What can I do with my stairs? Do not leave any items on the stairs. Make sure that there are handrails on both sides of the stairs and use them. Fix handrails that are broken or loose. Make sure that handrails are as long as the stairways. Check any carpeting to make sure that it is firmly attached to the stairs.  Fix any carpet that is loose or worn. Avoid having throw rugs at the top or bottom of the stairs. If you do have throw rugs, attach them to the floor with carpet tape. Make sure that you have a light switch at the top of the stairs and the bottom of the stairs. If you do not have them, ask someone to add them for you. What else can I do to help prevent falls? Wear shoes that: Do not have high heels. Have rubber bottoms. Are comfortable and fit you well. Are closed at the toe. Do not wear sandals. If you use a stepladder: Make sure that it is fully opened. Do not climb a closed stepladder. Make sure that both sides of the stepladder are locked into place. Ask someone to hold it for you, if possible. Clearly  mark and make sure that you can see: Any grab bars or handrails. First and last steps. Where the edge of each step is. Use tools that help you move around (mobility aids) if they are needed. These include: Canes. Walkers. Scooters. Crutches. Turn on the lights when you go into a dark area. Replace any light bulbs as soon as they burn out. Set up your furniture so you have a clear path. Avoid moving your furniture around. If any of your floors are uneven, fix them. If there are any pets around you, be aware of where they are. Review your medicines with your doctor. Some medicines can make you feel dizzy. This can increase your chance of falling. Ask your doctor what other things that you can do to help prevent falls. This information is not intended to replace advice given to you by your health care provider. Make sure you discuss any questions you have with your health care provider. Document Released: 07/17/2009 Document Revised: 02/26/2016 Document Reviewed: 10/25/2014 Elsevier Interactive Patient Education  2017 [employer].

## 2022-03-25 ENCOUNTER — Encounter: Payer: Self-pay | Admitting: Cardiology

## 2022-03-25 ENCOUNTER — Ambulatory Visit (INDEPENDENT_AMBULATORY_CARE_PROVIDER_SITE_OTHER): Payer: Medicare Other | Admitting: Cardiology

## 2022-03-25 VITALS — BP 118/62 | HR 56 | Ht 69.0 in | Wt 243.1 lb

## 2022-03-25 DIAGNOSIS — I2510 Atherosclerotic heart disease of native coronary artery without angina pectoris: Secondary | ICD-10-CM | POA: Diagnosis not present

## 2022-03-25 DIAGNOSIS — E669 Obesity, unspecified: Secondary | ICD-10-CM

## 2022-03-25 DIAGNOSIS — E782 Mixed hyperlipidemia: Secondary | ICD-10-CM

## 2022-03-25 DIAGNOSIS — I21A9 Other myocardial infarction type: Secondary | ICD-10-CM

## 2022-03-25 DIAGNOSIS — I2102 ST elevation (STEMI) myocardial infarction involving left anterior descending coronary artery: Secondary | ICD-10-CM

## 2022-03-25 DIAGNOSIS — I10 Essential (primary) hypertension: Secondary | ICD-10-CM

## 2022-03-25 DIAGNOSIS — Z955 Presence of coronary angioplasty implant and graft: Secondary | ICD-10-CM

## 2022-03-25 DIAGNOSIS — Z23 Encounter for immunization: Secondary | ICD-10-CM | POA: Insufficient documentation

## 2022-03-25 DIAGNOSIS — E559 Vitamin D deficiency, unspecified: Secondary | ICD-10-CM

## 2022-03-25 HISTORY — DX: Atherosclerotic heart disease of native coronary artery without angina pectoris: I25.10

## 2022-03-25 HISTORY — DX: Encounter for immunization: Z23

## 2022-03-25 MED ORDER — ASPIRIN 81 MG PO TBEC: 81.0000 mg | DELAYED_RELEASE_TABLET | Freq: Every day | ORAL | 3 refills | Status: AC

## 2022-03-25 NOTE — Patient Instructions (Signed)
Medication Instructions:  Your physician has recommended you make the following change in your medication:   Start taking 81 mg coated aspirin daily.  *If you need a refill on your cardiac medications before your next appointment, please call your pharmacy*   Lab Work: Your physician recommends that you return for lab work in: the next few days. You need to have labs done when you are fasting.  You do not need to make an appointment as the order has already been placed. The labs you are going to have done are BMET, CBC, TSH, vitamin D, A1C, LFT and Lipids.  [address] in [place]. They also close daily for lunch for 12-1.   [address] 2nd floor M-W 8-11:30 and 1-4:30 and Thursday and Friday 8-11:30.  If you have labs (blood work) drawn today and your tests are completely normal, you will receive your results only by: [place] (if you have MyChart) OR A paper copy in the mail If you have any lab test that is abnormal or we need to change your treatment, we will call you to review the results.   Testing/Procedures: None ordered   Follow-Up: At [facility] you and your health needs are our priority.  As part of our continuing mission to provide you with exceptional heart care, we have created designated Provider Care Teams.  These Care Teams include your primary Cardiologist (physician) and Advanced Practice Providers (APPs -  Physician Assistants and Nurse Practitioners) who all work together to provide you with the care you need, when you need it.  We recommend signing up for the patient portal called "MyChart".  Sign up information is provided on this After Visit Summary.  MyChart is used to connect with patients for Virtual Visits (Telemedicine).  Patients are able to view lab/test results, encounter notes, upcoming appointments, etc.  Non-urgent messages can be sent to your provider as well.   To learn more about what you can do  with MyChart, go to [email or web address].    Your next appointment:   9 month(s)  The format for your next appointment:   In Person  Provider:   Jyl Heinz, MD   Other Instructions NA

## 2022-03-25 NOTE — Progress Notes (Signed)
Cardiology Office Note:    Date:  03/25/2022   ID:  Thomas Carpenter, DOB 1950-10-18, MRN [id number]  PCP:  Shelda Pal, DO  Cardiologist:  Jenean Lindau, MD   Referring MD: Shelda Pal*    ASSESSMENT:    1. Mixed dyslipidemia   2. Coronary artery disease involving native coronary artery of native heart without angina pectoris   3. Status post coronary artery stent placement   4. Obesity (BMI 35.0-39.9 without comorbidity)    PLAN:    In order of problems listed above:  Coronary artery disease: Secondary prevention stressed with patient.  Importance of compliance with diet medication stressed any vocalized understanding.  He is doing well with exercise. History of cardiomyopathy: Now normalized ejection fraction.  Patient is on guideline directed medical therapy. Essential hypertension: Blood pressure stable and diet was emphasized. Mixed dyslipidemia, and obesity: Weight reduction stressed risks of obesity explained and he promises to refer. He has not had blood work for some time and will come for complete blood work in the next few days.  He requests hemoglobin A1c and vitamin D and we will do this for him.  He has history of vitamin D deficiency. Patient will be seen in follow-up appointment in 6 months or earlier if the patient has any concerns    Medication Adjustments/Labs and Tests Ordered: Current medicines are reviewed at length with the patient today.  Concerns regarding medicines are outlined above.  No orders of the defined types were placed in this encounter.  No orders of the defined types were placed in this encounter.    No chief complaint on file.    History of Present Illness:    Thomas Carpenter is a 71 y.o. male.  Patient has past medical history of coronary artery disease post STEMI, history of DVT and pulmonary embolism, dyslipidemia and obesity.  He denies any problems at this time and takes care of activities of daily  living.  No chest pain orthopnea or PND.  Walks 30 minutes a day without any problems.  At the time of my evaluation, the patient is alert awake oriented and in no distress.  Past Medical History:  Diagnosis Date   Actinic keratoses 63/14/9702   Acute diastolic CHF (congestive heart failure) ([place]) 07/04/2014   Acute ST elevation myocardial infarction (STEMI) due to occlusion of distal portion of left anterior descending (LAD) coronary artery ([place]) 12/14/2019   Acute ST elevation myocardial infarction (STEMI) due to occlusion of left anterior descending (LAD) coronary artery ([place]) 6/37/8588   Acute systolic congestive heart failure ([place]) 12/13/2019   Adiposity 12/03/2008   Last Assessment & Plan:  Formatting of this note might be different from the original. Obesity is improving with lifestyle modifications. Discussed the patient's BMI.  The BMI is above average; BMI management plan is completed. General weight loss/lifestyle modification strategies discussed (elicit support from others; identify saboteurs; non-food rewards, etc).   Atopic dermatitis 02/03/7740   Chronic systolic heart failure ([place]) 01/21/2020   CKD (chronic kidney disease) stage 2, GFR 60-89 ml/min 07/04/2014   CKD (chronic kidney disease) stage 4, GFR 15-29 ml/min ([place]) 12/13/2019   Colorectal polyps 04/16/2015   Coronary artery disease 06/25/2020   DVT (deep venous thrombosis) ([place])    DVT of lower extremity, bilateral ([place]) 07/04/2014   Eczema 02/24/2000   History of pulmonary embolism 06/25/2020   Hypercoagulable state ([place]) 04/16/2016   Last Assessment & Plan:  Formatting of this note might be different from the  original. Stable with apixaban   Hypertension    Hypertriglyceridemia 08/27/2014   Last Assessment & Plan:  Formatting of this note might be different from the original. Lipid abnormalities are improving with lifestyle modifications. Nutritional counseling was provided. Lipids will be reassessed in 1 year.   Insomnia 05/09/2007    Mixed dyslipidemia 06/25/2020   Obesity (BMI 35.0-39.9 without comorbidity) 06/25/2020   On intra-aortic balloon pump assist    Osteoarthritis of knee 11/19/2011   Last Assessment & Plan:  Formatting of this note might be different from the original. Deteriorated and proceed with referral to ortho   Other meniscus derangements, other lateral meniscus, unspecified knee 11/11/2011   Pneumonia    Pulmonary emboli [facility])    Pulmonary embolism during current hospitalization ([place]) 12/13/2019   ST elevation myocardial infarction (STEMI) [facility])    Status post coronary artery stent placement    Trapezius muscle spasm 08/21/2018   Formatting of this note might be different from the original. ROM exercises twice daily Cyclobenzaprine prn   Vitamin D deficiency 06/14/2018   Formatting of this note might be different from the original. Improved with supplement    Past Surgical History:  Procedure Laterality Date   CORONARY STENT INTERVENTION N/A 12/13/2019   Procedure: CORONARY STENT INTERVENTION;  Surgeon: Belva Crome, MD;  Location: [place] [facility];  Service: Cardiovascular;  Laterality: N/A;   CORONARY/GRAFT ACUTE MI REVASCULARIZATION N/A 12/13/2019   Procedure: Coronary/Graft Acute MI Revascularization;  Surgeon: Belva Crome, MD;  Location: [place] [facility];  Service: Cardiovascular;  Laterality: N/A;   IABP INSERTION N/A 12/13/2019   Procedure: IABP Insertion;  Surgeon: Belva Crome, MD;  Location: [place] [facility];  Service: Cardiovascular;  Laterality: N/A;   LEFT HEART CATH AND CORONARY ANGIOGRAPHY N/A 12/13/2019   Procedure: LEFT HEART CATH AND CORONARY ANGIOGRAPHY;  Surgeon: Belva Crome, MD;  Location: [place] [facility];  Service: Cardiovascular;  Laterality: N/A;    Current Medications: Current Meds  Medication Sig   atorvastatin (LIPITOR) 80 MG tablet Take 1 tablet (80 mg total) by mouth daily at 6 PM.   Cholecalciferol (VITAMIN D3) 125 MCG (5000 UT) CAPS Take 5,000 Units by  mouth daily with breakfast.   clobetasol ointment (TEMOVATE) 7.65 % Apply 1 application topically as needed for rash.   ENTRESTO 97-103 MG TAKE 1 TABLET BY MOUTH TWICE A DAY   hydrochlorothiazide (HYDRODIURIL) 12.5 MG tablet TAKE 1 TABLET BY MOUTH EVERY DAY   nitroGLYCERIN (NITROSTAT) 0.4 MG SL tablet Place 1 tablet (0.4 mg total) under the tongue every 5 (five) minutes x 3 doses as needed for chest pain.   spironolactone (ALDACTONE) 25 MG tablet Take 0.5 tablets (12.5 mg total) by mouth daily.   XARELTO 20 MG TABS tablet TAKE 1 TABLET BY MOUTH DAILY WITH SUPPER     Allergies:   Patient has no known allergies.   Social History   Socioeconomic History   Marital status: Single    Spouse name: Not on file   Number of children: Not on file   Years of education: Not on file   Highest education level: Not on file  Occupational History   Occupation: retired  Tobacco Use   Smoking status: Never   Smokeless tobacco: Current    Types: Chew  Vaping Use   Vaping Use: Never used  Substance and Sexual Activity   Alcohol use: Yes    Comment: daily   Drug use: Never   Sexual  activity: Not on file  Other Topics Concern   Not on file  Social History Narrative   Not on file   Social Determinants of Health   Financial Resource Strain: Low Risk  (03/04/2022)   Overall Financial Resource Strain (CARDIA)    Difficulty of Paying Living Expenses: Not hard at all  Food Insecurity: No Food Insecurity (03/04/2022)   Hunger Vital Sign    Worried About Running Out of Food in the Last Year: Never true    Ran Out of Food in the Last Year: Never true  Transportation Needs: No Transportation Needs (03/04/2022)   PRAPARE - Hydrologist (Medical): No    Lack of Transportation (Non-Medical): No  Physical Activity: Sufficiently Active (03/04/2022)   Exercise Vital Sign    Days of Exercise per Week: 7 days    Minutes of Exercise per Session: 60 min  Stress: No Stress Concern  Present (03/04/2022)   [place]    Feeling of Stress : Not at all  Social Connections: Socially Isolated (03/04/2022)   Social Connection and Isolation Panel [NHANES]    Frequency of Communication with Friends and Family: More than three times a week    Frequency of Social Gatherings with Friends and Family: More than three times a week    Attends Religious Services: Never    Marine scientist or Organizations: No    Attends Music therapist: Never    Marital Status: Divorced     Family History: The patient's family history includes Cancer in his mother; Heart attack in his paternal uncle; Hypertension in his father; Kidney disease in his mother. There is no history of Diabetes or Heart disease.  ROS:   Please see the history of present illness.    All other systems reviewed and are negative.  EKGs/Labs/Other Studies Reviewed:    The following studies were reviewed today: I discussed my findings with the patient at length   Recent Labs: 08/14/2021: ALT 18; BUN 21; Creatinine, Ser 1.28; Hemoglobin 15.3; Platelets 250; Potassium 5.0; Sodium 140 08/18/2021: TSH 3.47  Recent Lipid Panel    Component Value Date/Time   CHOL 113 08/14/2021 0956   TRIG 48 08/14/2021 0956   HDL 48 08/14/2021 0956   CHOLHDL 2.4 08/14/2021 0956   CHOLHDL 4.0 01/09/2020 1416   VLDL 25 01/09/2020 1416   LDLCALC 53 08/14/2021 0956    Physical Exam:    VS:  BP 118/62   Pulse (!) 56   Ht '5\' 9"'$  (1.753 m)   Wt 243 lb 1.3 oz (110.3 kg)   SpO2 96%   BMI 35.90 kg/m     Wt Readings from Last 3 Encounters:  03/25/22 243 lb 1.3 oz (110.3 kg)  03/04/22 245 lb 9.6 oz (111.4 kg)  08/18/21 251 lb (113.9 kg)     GEN: Patient is in no acute distress HEENT: Normal NECK: No JVD; No carotid bruits LYMPHATICS: No lymphadenopathy CARDIAC: Hear sounds regular, 2/6 systolic murmur at the apex. RESPIRATORY:  Clear to  auscultation without rales, wheezing or rhonchi  ABDOMEN: Soft, non-tender, non-distended MUSCULOSKELETAL:  No edema; No deformity  SKIN: Warm and dry NEUROLOGIC:  Alert and oriented x 3 PSYCHIATRIC:  Normal affect   Signed, Jenean Lindau, MD  03/25/2022 1:29 PM    [facility]

## 2022-03-27 LAB — BASIC METABOLIC PANEL
BUN/Creatinine Ratio: 14 (ref 10–24)
BUN: 23 mg/dL (ref 8–27)
CO2: 21 mmol/L (ref 20–29)
Calcium: 9.5 mg/dL (ref 8.6–10.2)
Chloride: 98 mmol/L (ref 96–106)
Creatinine, Ser: 1.62 mg/dL — ABNORMAL HIGH (ref 0.76–1.27)
Glucose: 105 mg/dL — ABNORMAL HIGH (ref 70–99)
Potassium: 5.5 mmol/L — ABNORMAL HIGH (ref 3.5–5.2)
Sodium: 134 mmol/L (ref 134–144)
eGFR: 46 mL/min/{1.73_m2} — ABNORMAL LOW (ref >59)

## 2022-03-27 LAB — HEMOGLOBIN A1C
Est. average glucose Bld gHb Est-mCnc: 114 mg/dL
Hgb A1c MFr Bld: 5.6 % (ref 4.8–5.6)

## 2022-03-27 LAB — HEPATIC FUNCTION PANEL
ALT: 19 IU/L (ref 0–44)
AST: 17 IU/L (ref 0–40)
Albumin: 4.2 g/dL (ref 3.8–4.8)
Alkaline Phosphatase: 72 IU/L (ref 44–121)
Bilirubin Total: 0.9 mg/dL (ref 0.0–1.2)
Bilirubin, Direct: 0.31 mg/dL (ref 0.00–0.40)
Total Protein: 6.7 g/dL (ref 6.0–8.5)

## 2022-03-27 LAB — TSH: TSH: 4.85 u[IU]/mL — ABNORMAL HIGH (ref 0.450–4.500)

## 2022-03-27 LAB — LIPID PANEL
Chol/HDL Ratio: 2.4 ratio (ref 0.0–5.0)
Cholesterol, Total: 102 mg/dL (ref 100–199)
HDL: 43 mg/dL (ref >39)
LDL Chol Calc (NIH): 40 mg/dL (ref 0–99)
Triglycerides: 104 mg/dL (ref 0–149)
VLDL Cholesterol Cal: 19 mg/dL (ref 5–40)

## 2022-03-27 LAB — VITAMIN D 25 HYDROXY (VIT D DEFICIENCY, FRACTURES): Vit D, 25-Hydroxy: 52.7 ng/mL (ref 30.0–100.0)

## 2022-05-04 ENCOUNTER — Other Ambulatory Visit: Payer: Self-pay | Admitting: Family Medicine

## 2022-05-13 ENCOUNTER — Other Ambulatory Visit (HOSPITAL_COMMUNITY): Payer: Self-pay | Admitting: Family Medicine

## 2022-06-25 ENCOUNTER — Other Ambulatory Visit: Payer: Self-pay | Admitting: Cardiology

## 2022-08-11 ENCOUNTER — Other Ambulatory Visit: Payer: Self-pay | Admitting: Cardiology

## 2022-08-25 ENCOUNTER — Other Ambulatory Visit: Payer: Self-pay | Admitting: Family Medicine

## 2022-09-06 ENCOUNTER — Other Ambulatory Visit (HOSPITAL_COMMUNITY): Payer: Self-pay | Admitting: Cardiology

## 2022-09-06 DIAGNOSIS — Z86711 Personal history of pulmonary embolism: Secondary | ICD-10-CM

## 2022-09-08 ENCOUNTER — Other Ambulatory Visit: Payer: Self-pay | Admitting: Family Medicine

## 2022-09-08 ENCOUNTER — Other Ambulatory Visit: Payer: Self-pay | Admitting: Cardiology

## 2022-09-08 DIAGNOSIS — Z86711 Personal history of pulmonary embolism: Secondary | ICD-10-CM

## 2022-09-08 MED ORDER — ENTRESTO 97-103 MG PO TABS: 1.0000 | ORAL_TABLET | Freq: Two times a day (BID) | ORAL | 1 refills | Status: DC

## 2022-09-08 NOTE — Telephone Encounter (Signed)
*  STAT* If patient is at the pharmacy, call can be transferred to refill team.   1. Which medications need to be refilled? (please list name of each medication and dose if known) XARELTO 20 MG TABS tablet  ENTRESTO 97-103 MG   2. Which pharmacy/location (including street and city if local pharmacy) is medication to be sent to? [id number] [place], [place] - [address]  3. Do they need a 30 day or 90 day supply? 30 day  Patient is out of medication

## 2022-09-12 ENCOUNTER — Other Ambulatory Visit: Payer: Self-pay | Admitting: Family Medicine

## 2022-09-25 ENCOUNTER — Other Ambulatory Visit: Payer: Self-pay | Admitting: Family Medicine

## 2022-10-26 ENCOUNTER — Other Ambulatory Visit: Payer: Self-pay | Admitting: Family Medicine

## 2022-11-01 ENCOUNTER — Ambulatory Visit (INDEPENDENT_AMBULATORY_CARE_PROVIDER_SITE_OTHER): Payer: Medicare Other | Admitting: Family Medicine

## 2022-11-01 ENCOUNTER — Encounter: Payer: Self-pay | Admitting: Family Medicine

## 2022-11-01 VITALS — BP 120/70 | HR 65 | Temp 98.4°F | Ht 69.0 in | Wt 235.1 lb

## 2022-11-01 DIAGNOSIS — M79671 Pain in right foot: Secondary | ICD-10-CM

## 2022-11-01 MED ORDER — PREDNISONE 20 MG PO TABS: 40.0000 mg | ORAL_TABLET | Freq: Every day | ORAL | 0 refills | Status: AC

## 2022-11-01 NOTE — Progress Notes (Signed)
Musculoskeletal Exam  Patient: Thomas Carpenter DOB: 05/25/1951  DOS: 11/01/2022  SUBJECTIVE:  Chief Complaint:   Chief Complaint  Patient presents with   Foot Swelling    Right foot pain and swelling Since last Wednesday     Thomas Carpenter is a 72 y.o.  male for evaluation and treatment of R foot pain pain.   Onset:  5 days ago. No inj or change in activity.  Location: R MTP, lateral foot also  Character:   dull   Progression of issue:  is unchanged Associated symptoms: redness, swelling Treatment: to date has been doxycycline, ice.   Neurovascular symptoms: no No hx of gout, did have 4 beers the day prior to onset.   Past Medical History:  Diagnosis Date   Actinic keratoses 09/01/2020   Acute ST elevation myocardial infarction (STEMI) due to occlusion of distal portion of left anterior descending (LAD) coronary artery ([place]) 11/57/2620   Acute systolic congestive heart failure ([place]) 12/13/2019   Adiposity 12/03/2008   Last Assessment & Plan:  Formatting of this note might be different from the original. Obesity is improving with lifestyle modifications. Discussed the patient's BMI.  The BMI is above average; BMI management plan is completed. General weight loss/lifestyle modification strategies discussed (elicit support from others; identify saboteurs; non-food rewards, etc).   Atopic dermatitis 35/59/7416   Chronic systolic heart failure ([place]) 01/21/2020   CKD (chronic kidney disease) stage 2, GFR 60-89 ml/min 07/04/2014   CKD (chronic kidney disease) stage 4, GFR 15-29 ml/min (HCC) 12/13/2019   Colorectal polyps 04/16/2015   Coronary artery disease 06/25/2020   DVT (deep venous thrombosis) ([facility])    DVT of lower extremity, bilateral ([place]) 07/04/2014   Eczema 02/24/2000   History of pulmonary embolism 06/25/2020   Hypercoagulable state ([place]) 04/16/2016   Last Assessment & Plan:  Formatting of this note might be different from the original. Stable with apixaban    Hypertension    Hypertriglyceridemia 08/27/2014   Last Assessment & Plan:  Formatting of this note might be different from the original. Lipid abnormalities are improving with lifestyle modifications. Nutritional counseling was provided. Lipids will be reassessed in 1 year.   Insomnia 05/09/2007   Mixed dyslipidemia 06/25/2020   Obesity (BMI 35.0-39.9 without comorbidity) 06/25/2020   On intra-aortic balloon pump assist    Osteoarthritis of knee 11/19/2011   Last Assessment & Plan:  Formatting of this note might be different from the original. Deteriorated and proceed with referral to ortho   Other meniscus derangements, other lateral meniscus, unspecified knee 11/11/2011   Pneumonia    Pulmonary emboli (H[facility])    Pulmonary embolism during current hospitalization ([place]) 12/13/2019   ST elevation myocardial infarction (STEMI) [facility])    Status post coronary artery stent placement    Trapezius muscle spasm 08/21/2018   Formatting of this note might be different from the original. ROM exercises twice daily Cyclobenzaprine prn   Vitamin D deficiency 06/14/2018   Formatting of this note might be different from the original. Improved with supplement    Objective: VITAL SIGNS: BP 120/70 (BP Location: Left Arm, Patient Position: Sitting, Cuff Size: Normal)   Pulse 65   Temp 98.4 F (36.9 C) (Oral)   Ht '5\' 9"'$  (1.753 m)   Wt 235 lb 2 oz (106.7 kg)   SpO2 99%   BMI 34.72 kg/m  Constitutional: Well formed, well developed. No acute distress. Thorax & Lungs: No accessory muscle use Musculoskeletal: R foot.   Tenderness to palpation: yes over  1st MTP Deformity: no Ecchymosis: no + pitting edema over dorsum of forefoot +erythema/warmth over area Neurologic: Normal sensory function. Gait antalgic.  Psychiatric: Normal mood. Age appropriate judgment and insight. Alert & oriented x 3.    Assessment:  Right foot pain - Plan: Basic metabolic panel, predniSONE (DELTASONE) 20 MG tablet, Uric  acid  Screen for colon cancer  Plan: 5 d pred burst 40 mg/d, ck urate, ice, Tylenol. Suspect gout.  To inquire about tetanus booster at pharmacy. [facility] and they have not. Will offer PCV20 at next visit.  F/u as originally scheduled.  The patient voiced understanding and agreement to the plan.   [place], DO 11/01/22  3:35 PM

## 2022-11-01 NOTE — Patient Instructions (Addendum)
Ice/cold pack over area for 10-15 min twice daily.  OK to take Tylenol 1000 mg (2 extra strength tabs) or 975 mg (3 regular strength tabs) every 6 hours as needed.  Give [place] 2-3 business days to get the results of your labs back.  Let us know if you need anything.

## 2022-11-02 ENCOUNTER — Other Ambulatory Visit: Payer: Self-pay | Admitting: Family Medicine

## 2022-11-02 LAB — BASIC METABOLIC PANEL
BUN: 32 mg/dL — ABNORMAL HIGH (ref 6–23)
CO2: 28 mEq/L (ref 19–32)
Calcium: 9 mg/dL (ref 8.4–10.5)
Chloride: 97 mEq/L (ref 96–112)
Creatinine, Ser: 1.47 mg/dL (ref 0.40–1.50)
GFR: 48.14 mL/min — ABNORMAL LOW (ref >60.00)
Glucose, Bld: 90 mg/dL (ref 70–99)
Potassium: 4.3 mEq/L (ref 3.5–5.1)
Sodium: 134 mEq/L — ABNORMAL LOW (ref 135–145)

## 2022-11-02 LAB — URIC ACID: Uric Acid, Serum: 7.9 mg/dL — ABNORMAL HIGH (ref 4.0–7.8)

## 2022-11-02 MED ORDER — ALLOPURINOL 100 MG PO TABS: 100.0000 mg | ORAL_TABLET | Freq: Every day | ORAL | 6 refills | Status: DC

## 2022-11-24 ENCOUNTER — Other Ambulatory Visit: Payer: Self-pay | Admitting: Family Medicine

## 2022-12-02 ENCOUNTER — Other Ambulatory Visit: Payer: Self-pay

## 2022-12-02 DIAGNOSIS — M79671 Pain in right foot: Secondary | ICD-10-CM

## 2022-12-03 ENCOUNTER — Encounter: Payer: Self-pay | Admitting: Family Medicine

## 2022-12-03 ENCOUNTER — Ambulatory Visit (INDEPENDENT_AMBULATORY_CARE_PROVIDER_SITE_OTHER): Payer: Medicare Other | Admitting: Family Medicine

## 2022-12-03 VITALS — BP 118/80 | HR 49 | Temp 98.1°F | Ht 69.0 in | Wt 235.5 lb

## 2022-12-03 DIAGNOSIS — M1A0710 Idiopathic chronic gout, right ankle and foot, without tophus (tophi): Secondary | ICD-10-CM | POA: Diagnosis not present

## 2022-12-03 DIAGNOSIS — Z23 Encounter for immunization: Secondary | ICD-10-CM | POA: Diagnosis not present

## 2022-12-03 MED ORDER — COLCHICINE 0.6 MG PO TABS: ORAL_TABLET | ORAL | 1 refills | Status: DC

## 2022-12-03 NOTE — Addendum Note (Signed)
Addended by: Sharon Seller B on: 12/03/2022 02:44 PM   Modules accepted: Orders

## 2022-12-03 NOTE — Progress Notes (Signed)
Chief Complaint  Patient presents with   Follow-up    gout    Thomas Carpenter is a 72 y.o. male here for gout.  Currently being treated with allopurinol 100 mg/d. The joint(s) affected include: R foot Most recent uric acid level is: 7.9 Reports compliance. Side effects of medications: None Is avoiding seafood, sweet/sugary beverages, alcohol, and red meats.  Past Medical History:  Diagnosis Date   Actinic keratoses 09/01/2020   Acute ST elevation myocardial infarction (STEMI) due to occlusion of distal portion of left anterior descending (LAD) coronary artery ([place]) [id number]   Acute systolic congestive heart failure ([place]) 12/13/2019   Adiposity 12/03/2008   Last Assessment & Plan:  Formatting of this note might be different from the original. Obesity is improving with lifestyle modifications. Discussed the patient's BMI.  The BMI is above average; BMI management plan is completed. General weight loss/lifestyle modification strategies discussed (elicit support from others; identify saboteurs; non-food rewards, etc).   Atopic dermatitis [id number]   Chronic systolic heart failure ([place]) 01/21/2020   CKD (chronic kidney disease) stage 2, GFR 60-89 ml/min 07/04/2014   CKD (chronic kidney disease) stage 4, GFR 15-29 ml/min (HCC) 12/13/2019   Colorectal polyps 04/16/2015   Coronary artery disease 06/25/2020   DVT (deep venous thrombosis) (HCC)    DVT of lower extremity, bilateral ([place]) 07/04/2014   Eczema 02/24/2000   History of pulmonary embolism 06/25/2020   Hypercoagulable state ([place]) 04/16/2016   Last Assessment & Plan:  Formatting of this note might be different from the original. Stable with apixaban   Hypertension    Hypertriglyceridemia 08/27/2014   Last Assessment & Plan:  Formatting of this note might be different from the original. Lipid abnormalities are improving with lifestyle modifications. Nutritional counseling was provided. Lipids will be reassessed in 1 year.    Insomnia 05/09/2007   Mixed dyslipidemia 06/25/2020   Obesity (BMI 35.0-39.9 without comorbidity) 06/25/2020   On intra-aortic balloon pump assist    Osteoarthritis of knee 11/19/2011   Last Assessment & Plan:  Formatting of this note might be different from the original. Deteriorated and proceed with referral to ortho   Other meniscus derangements, other lateral meniscus, unspecified knee 11/11/2011   Pneumonia    Pulmonary emboli (HCC)    Pulmonary embolism during current hospitalization ([place]) 12/13/2019   ST elevation myocardial infarction (STEMI) [facility])    Status post coronary artery stent placement    Trapezius muscle spasm 08/21/2018   Formatting of this note might be different from the original. ROM exercises twice daily Cyclobenzaprine prn   Vitamin D deficiency 06/14/2018   Formatting of this note might be different from the original. Improved with supplement    BP 118/80 (BP Location: Left Arm, Patient Position: Sitting, Cuff Size: Normal)   Pulse (!) 49   Temp 98.1 F (36.7 C) (Oral)   Ht '5\' 9"'$  (1.753 m)   Wt 235 lb 8 oz (106.8 kg)   SpO2 99%   BMI 34.78 kg/m  Gen: Awake, alert, appears stated age Heart: Bradycardic, regular rhythm, no LE edema Lungs: CTAB, no accessory muscle use MSK: no swelling or TTP, no excessive warmth Skin: No erythema  Psych: Age appropriate judgment and insight, nml mood and affect  Chronic gout of right foot, unspecified cause - Plan: Basic metabolic panel, Uric acid  Chronic, unsure if stable.  Colchicine given for as needed use.  For now, continue allopurinol 100 mg daily. Reminded to avoid foods like alcohol, sweet beverages, red meat, lunch  meat, sea food. F/u in 6 months. The patient voiced understanding and agreement to the plan.  [place], DO 12/03/22 2:29 PM

## 2022-12-03 NOTE — Patient Instructions (Signed)
Foods to AVOID: Red meat, organ meat (liver), lunch meat, seafood (mussels, scallops, anchovies, etc) Alcohol Sugary foods/beverages (diet soft drinks have no link to flares)  Foods to migrate to: Dairy Vegetables Cherries have limited data to suggest they help lower uric acid levels (and prevent flares) Vit C (500 mg daily) may have a modest effect with preventing flares Poultry If you are going to eat red meat, beef and pork may give you less problems than lamb.  Let us know if you need anything.

## 2022-12-04 LAB — BASIC METABOLIC PANEL
BUN/Creatinine Ratio: 18 (calc) (ref 6–22)
BUN: 27 mg/dL — ABNORMAL HIGH (ref 7–25)
CO2: 26 mmol/L (ref 20–32)
Calcium: 9.2 mg/dL (ref 8.6–10.3)
Chloride: 104 mmol/L (ref 98–110)
Creat: 1.46 mg/dL — ABNORMAL HIGH (ref 0.70–1.28)
Glucose, Bld: 92 mg/dL (ref 65–99)
Potassium: 4.4 mmol/L (ref 3.5–5.3)
Sodium: 138 mmol/L (ref 135–146)

## 2022-12-04 LAB — URIC ACID: Uric Acid, Serum: 6.8 mg/dL (ref 4.0–8.0)

## 2022-12-23 ENCOUNTER — Other Ambulatory Visit: Payer: Self-pay | Admitting: Family Medicine

## 2022-12-23 ENCOUNTER — Ambulatory Visit: Payer: Medicare Other | Attending: Cardiology | Admitting: Cardiology

## 2022-12-23 VITALS — BP 98/56 | HR 56 | Ht 69.0 in | Wt 232.0 lb

## 2022-12-23 DIAGNOSIS — E669 Obesity, unspecified: Secondary | ICD-10-CM

## 2022-12-23 DIAGNOSIS — E781 Pure hyperglyceridemia: Secondary | ICD-10-CM | POA: Insufficient documentation

## 2022-12-23 DIAGNOSIS — N182 Chronic kidney disease, stage 2 (mild): Secondary | ICD-10-CM

## 2022-12-23 DIAGNOSIS — I2510 Atherosclerotic heart disease of native coronary artery without angina pectoris: Secondary | ICD-10-CM | POA: Diagnosis present

## 2022-12-23 DIAGNOSIS — Z955 Presence of coronary angioplasty implant and graft: Secondary | ICD-10-CM

## 2022-12-23 DIAGNOSIS — I10 Essential (primary) hypertension: Secondary | ICD-10-CM | POA: Insufficient documentation

## 2022-12-23 DIAGNOSIS — E782 Mixed hyperlipidemia: Secondary | ICD-10-CM | POA: Diagnosis present

## 2022-12-23 NOTE — Progress Notes (Signed)
Cardiology Office Note:    Date:  12/23/2022   ID:  Thomas Carpenter, DOB 01-28-1951, MRN [id number]  PCP:  Shelda Pal, DO  Cardiologist:  Jenean Lindau, MD   Referring MD: Shelda Pal*    ASSESSMENT:    1. Coronary artery disease involving native coronary artery of native heart without angina pectoris   2. Primary hypertension   3. Status post coronary artery stent placement   4. Obesity (BMI 35.0-39.9 without comorbidity)   5. Mixed dyslipidemia   6. Hypertriglyceridemia   7. CKD (chronic kidney disease) stage 2, GFR 60-89 ml/min    PLAN:    In order of problems listed above:  Coronary artery disease: Secondary prevention stressed with the patient.  Importance of compliance with diet medication stressed and vocalized understanding.  He was advised to continue his excellent exercise protocol. Essential hypertension: Blood pressure stable and diet was emphasized.  He has borderline blood pressure but he is asymptomatic. History of cardiomyopathy: On Entresto.  His ejection fraction is stable and we will do echocardiogram since the last was done in 2021. Mixed dyslipidemia: On lipid-lowering medications followed by primary care.  Lipids reviewed. Obesity: Weight reduction stressed risks of obesity explained and he promises to do better. History of pulmonary embolism: On anticoagulation.  This is managed by primary care.  Anticoagulation benefits and potential risks explained and patient's questions were answered to satisfaction. Patient will be seen in follow-up appointment in 9 months or earlier if the patient has any concerns.    Medication Adjustments/Labs and Tests Ordered: Current medicines are reviewed at length with the patient today.  Concerns regarding medicines are outlined above.  No orders of the defined types were placed in this encounter.  No orders of the defined types were placed in this encounter.    No chief complaint on file.     History of Present Illness:    Thomas Carpenter is a 72 y.o. male.  Patient has past medical history of coronary artery disease, essential hypertension, mixed dyslipidemia and history of pulmonary embolism.  He denies any problems at this time and takes care of activities of daily living.  No chest pain orthopnea or PND.  At the time of my evaluation, the patient is alert awake oriented and in no distress.  He walks at least half an hour a day 5 days a week.  Past Medical History:  Diagnosis Date   Actinic keratoses [id number]   Acute diastolic CHF (congestive heart failure) ([place]) 07/04/2014   Acute ST elevation myocardial infarction (STEMI) due to occlusion of distal portion of left anterior descending (LAD) coronary artery ([place]) 12/14/2019   Acute ST elevation myocardial infarction (STEMI) due to occlusion of left anterior descending (LAD) coronary artery ([place]) [id number]   Acute systolic congestive heart failure ([place]) 12/13/2019   Adiposity 12/03/2008   Last Assessment & Plan:  Formatting of this note might be different from the original. Obesity is improving with lifestyle modifications. Discussed the patient's BMI.  The BMI is above average; BMI management plan is completed. General weight loss/lifestyle modification strategies discussed (elicit support from others; identify saboteurs; non-food rewards, etc).   Atopic dermatitis 01/15/2015   CAD (coronary artery disease) [id number]   Chronic systolic heart failure ([place]) 01/21/2020   CKD (chronic kidney disease) stage 2, GFR 60-89 ml/min 07/04/2014   CKD (chronic kidney disease) stage 4, GFR 15-29 ml/min ([facility]) 12/13/2019   Colorectal polyps 04/16/2015   DVT (deep venous thrombosis) ([place])  DVT of lower extremity, bilateral ([place]) 07/04/2014   Eczema 02/24/2000   History of pulmonary embolism 06/25/2020   Hypercoagulable state ([place]) 04/16/2016   Last Assessment & Plan:  Formatting of this note might be different from the original. Stable  with apixaban   Hypertension    Hypertriglyceridemia 08/27/2014   Last Assessment & Plan:  Formatting of this note might be different from the original. Lipid abnormalities are improving with lifestyle modifications. Nutritional counseling was provided. Lipids will be reassessed in 1 year.   Insomnia 05/09/2007   Mixed dyslipidemia 06/25/2020   Need for prophylactic vaccination and inoculation against influenza 03/25/2022   Obesity (BMI 35.0-39.9 without comorbidity) 06/25/2020   On intra-aortic balloon pump assist    Osteoarthritis of knee 11/19/2011   Last Assessment & Plan:  Formatting of this note might be different from the original. Deteriorated and proceed with referral to ortho   Other meniscus derangements, other lateral meniscus, unspecified knee 11/11/2011   Pneumonia    Pulmonary emboli [facility])    Pulmonary embolism during current hospitalization ([place]) 12/13/2019   ST elevation myocardial infarction (STEMI) [facility])    Status post coronary artery stent placement    Trapezius muscle spasm 08/21/2018   Formatting of this note might be different from the original. ROM exercises twice daily Cyclobenzaprine prn   Vitamin D deficiency 06/14/2018   Formatting of this note might be different from the original. Improved with supplement    Past Surgical History:  Procedure Laterality Date   CORONARY STENT INTERVENTION N/A 12/13/2019   Procedure: CORONARY STENT INTERVENTION;  Surgeon: Belva Crome, MD;  Location: [facility];  Service: Cardiovascular;  Laterality: N/A;   CORONARY/GRAFT ACUTE MI REVASCULARIZATION N/A 12/13/2019   Procedure: Coronary/Graft Acute MI Revascularization;  Surgeon: Belva Crome, MD;  Location: [place] [facility];  Service: Cardiovascular;  Laterality: N/A;   IABP INSERTION N/A 12/13/2019   Procedure: IABP Insertion;  Surgeon: Belva Crome, MD;  Location: [place];  Service: Cardiovascular;  Laterality: N/A;   LEFT HEART CATH AND CORONARY  ANGIOGRAPHY N/A 12/13/2019   Procedure: LEFT HEART CATH AND CORONARY ANGIOGRAPHY;  Surgeon: Belva Crome, MD;  Location: [place] [facility];  Service: Cardiovascular;  Laterality: N/A;    Current Medications: Current Meds  Medication Sig   allopurinol (ZYLOPRIM) 100 MG tablet Take 1 tablet (100 mg total) by mouth daily.   aspirin EC 81 MG tablet Take 1 tablet (81 mg total) by mouth daily. Swallow whole.   atorvastatin (LIPITOR) 80 MG tablet TAKE 1 TABLET BY MOUTH EVERY DAY   Cholecalciferol (VITAMIN D3) 125 MCG (5000 UT) CAPS Take 5,000 Units by mouth daily with breakfast.   clobetasol ointment (TEMOVATE) [id number] % Apply 1 application topically as needed for rash.   colchicine 0.6 MG tablet Take 2 tabs and repeat 1 tab in an hr and then take 1 tab daily until resolution of flare.   hydrochlorothiazide (HYDRODIURIL) 12.5 MG tablet TAKE 1 TABLET BY MOUTH EVERY DAY   nitroGLYCERIN (NITROSTAT) 0.4 MG SL tablet PLACE 1 TABLET (0.4 MG TOTAL) UNDER THE TONGUE EVERY 5 (FIVE) MINUTES X 3 DOSES AS NEEDED FOR CHEST PAIN.   sacubitril-valsartan (ENTRESTO) 97-103 MG Take 1 tablet by mouth 2 (two) times daily.   spironolactone (ALDACTONE) 25 MG tablet Take 0.5 tablets (12.5 mg total) by mouth daily.   XARELTO 20 MG TABS tablet TAKE 1 TABLET BY MOUTH DAILY WITH SUPPER     Allergies:  Patient has no known allergies.   Social History   Socioeconomic History   Marital status: Single    Spouse name: Not on file   Number of children: Not on file   Years of education: Not on file   Highest education level: Not on file  Occupational History   Occupation: retired  Tobacco Use   Smoking status: Never   Smokeless tobacco: Current    Types: Chew  Vaping Use   Vaping Use: Never used  Substance and Sexual Activity   Alcohol use: Yes    Comment: daily   Drug use: Never   Sexual activity: Not on file  Other Topics Concern   Not on file  Social History Narrative   Not on file   Social Determinants of  Health   Financial Resource Strain: Low Risk  (03/04/2022)   Overall Financial Resource Strain (CARDIA)    Difficulty of Paying Living Expenses: Not hard at all  Food Insecurity: No Food Insecurity (03/04/2022)   Hunger Vital Sign    Worried About Running Out of Food in the Last Year: Never true    [place] in the Last Year: Never true  Transportation Needs: No Transportation Needs (03/04/2022)   PRAPARE - Hydrologist (Medical): No    Lack of Transportation (Non-Medical): No  Physical Activity: Sufficiently Active (03/04/2022)   Exercise Vital Sign    Days of Exercise per Week: 7 days    Minutes of Exercise per Session: 60 min  Stress: No Stress Concern Present (03/04/2022)   [place]    Feeling of Stress : Not at all  Social Connections: Socially Isolated (03/04/2022)   Social Connection and Isolation Panel [NHANES]    Frequency of Communication with Friends and Family: More than three times a week    Frequency of Social Gatherings with Friends and Family: More than three times a week    Attends Religious Services: Never    Marine scientist or Organizations: No    Attends Music therapist: Never    Marital Status: Divorced     Family History: The patient's family history includes Cancer in his mother; Heart attack in his paternal uncle; Hypertension in his father; Kidney disease in his mother. There is no history of Diabetes or Heart disease.  ROS:   Please see the history of present illness.    All other systems reviewed and are negative.  EKGs/Labs/Other Studies Reviewed:    The following studies were reviewed today: EKG reveals sinus rhythm poor anterior forces and nonspecific ST-T changes   Recent Labs: 03/26/2022: ALT 19; TSH 4.850 12/03/2022: BUN 27; Creat 1.46; Potassium 4.4; Sodium 138  Recent Lipid Panel    Component Value Date/Time   CHOL 102  03/26/2022 1031   TRIG 104 03/26/2022 1031   HDL 43 03/26/2022 1031   CHOLHDL 2.4 03/26/2022 1031   CHOLHDL 4.0 01/09/2020 1416   VLDL 25 01/09/2020 1416   LDLCALC 40 03/26/2022 1031    Physical Exam:    VS:  BP (!) 98/56   Pulse (!) 56   Ht 5\' 9"  (1.753 m)   Wt 232 lb 0.6 oz (105.3 kg)   SpO2 96%   BMI 34.27 kg/m     Wt Readings from Last 3 Encounters:  12/23/22 232 lb 0.6 oz (105.3 kg)  12/03/22 235 lb 8 oz (106.8 kg)  11/01/22 235 lb 2 oz (  106.7 kg)     GEN: Patient is in no acute distress HEENT: Normal NECK: No JVD; No carotid bruits LYMPHATICS: No lymphadenopathy CARDIAC: Hear sounds regular, 2/6 systolic murmur at the apex. RESPIRATORY:  Clear to auscultation without rales, wheezing or rhonchi  ABDOMEN: Soft, non-tender, non-distended MUSCULOSKELETAL:  No edema; No deformity  SKIN: Warm and dry NEUROLOGIC:  Alert and oriented x 3 PSYCHIATRIC:  Normal affect   Signed, Jenean Lindau, MD  12/23/2022 2:33 PM    [place]

## 2022-12-23 NOTE — Patient Instructions (Signed)
Medication Instructions:  Your physician recommends that you continue on your current medications as directed. Please refer to the Current Medication list given to you today.  *If you need a refill on your cardiac medications before your next appointment, please call your pharmacy*   Lab Work: None ordered If you have labs (blood work) drawn today and your tests are completely normal, you will receive your results only by: MyChart Message (if you have MyChart) OR A paper copy in the mail If you have any lab test that is abnormal or we need to change your treatment, we will call you to review the results.   Testing/Procedures: Your physician has requested that you have an echocardiogram. Echocardiography is a painless test that uses sound waves to create images of your heart. It provides your doctor with information about the size and shape of your heart and how well your heart's chambers and valves are working. This procedure takes approximately one hour. There are no restrictions for this procedure. Please do NOT wear cologne, perfume, aftershave, or lotions (deodorant is allowed). Please arrive 15 minutes prior to your appointment time.     Follow-Up: At [facility], you and your health needs are our priority.  As part of our continuing mission to provide you with exceptional heart care, we have created designated Provider Care Teams.  These Care Teams include your primary Cardiologist (physician) and Advanced Practice Providers (APPs -  Physician Assistants and Nurse Practitioners) who all work together to provide you with the care you need, when you need it.  We recommend signing up for the patient portal called "MyChart".  Sign up information is provided on this After Visit Summary.  MyChart is used to connect with patients for Virtual Visits (Telemedicine).  Patients are able to view lab/test results, encounter notes, upcoming appointments, etc.  Non-urgent messages can be sent to  your provider as well.   To learn more about what you can do with MyChart, go to [email or web address].    Your next appointment:   9 month(s)  The format for your next appointment:   In Person  Provider:   Rajan Revankar, MD   Other Instructions Echocardiogram An echocardiogram is a test that uses sound waves (ultrasound) to produce images of the heart. Images from an echocardiogram can provide important information about: Heart size and shape. The size and thickness and movement of your heart's walls. Heart muscle function and strength. Heart valve function or if you have stenosis. Stenosis is when the heart valves are too narrow. If blood is flowing backward through the heart valves (regurgitation). A tumor or infectious growth around the heart valves. Areas of heart muscle that are not working well because of poor blood flow or injury from a heart attack. Aneurysm detection. An aneurysm is a weak or damaged part of an artery wall. The wall bulges out from the normal force of blood pumping through the body. Tell a health care provider about: Any allergies you have. All medicines you are taking, including vitamins, herbs, eye drops, creams, and over-the-counter medicines. Any blood disorders you have. Any surgeries you have had. Any medical conditions you have. Whether you are pregnant or may be pregnant. What are the risks? Generally, this is a safe test. However, problems may occur, including an allergic reaction to dye (contrast) that may be used during the test. What happens before the test? No specific preparation is needed. You may eat and drink normally. What happens during the test? You will   take off your clothes from the waist up and put on a hospital gown. Electrodes or electrocardiogram (ECG)patches may be placed on your chest. The electrodes or patches are then connected to a device that monitors your heart rate and rhythm. You will lie down on a table for an  ultrasound exam. A gel will be applied to your chest to help sound waves pass through your skin. A handheld device, called a transducer, will be pressed against your chest and moved over your heart. The transducer produces sound waves that travel to your heart and bounce back (or "echo" back) to the transducer. These sound waves will be captured in real-time and changed into images of your heart that can be viewed on a video monitor. The images will be recorded on a computer and reviewed by your health care provider. You may be asked to change positions or hold your breath for a short time. This makes it easier to get different views or better views of your heart. In some cases, you may receive contrast through an IV in one of your veins. This can improve the quality of the pictures from your heart. The procedure may vary among health care providers and hospitals.   What can I expect after the test? You may return to your normal, everyday life, including diet, activities, and medicines, unless your health care provider tells you not to do that. Follow these instructions at home: It is up to you to get the results of your test. Ask your health care provider, or the department that is doing the test, when your results will be ready. Keep all follow-up visits. This is important. Summary An echocardiogram is a test that uses sound waves (ultrasound) to produce images of the heart. Images from an echocardiogram can provide important information about the size and shape of your heart, heart muscle function, heart valve function, and other possible heart problems. You do not need to do anything to prepare before this test. You may eat and drink normally. After the echocardiogram is completed, you may return to your normal, everyday life, unless your health care provider tells you not to do that. This information is not intended to replace advice given to you by your health care provider. Make sure you  discuss any questions you have with your health care provider. Document Revised: 05/13/2020 Document Reviewed: 05/13/2020 Elsevier Patient Education  2021 [employer] Inc[employer]   Important Information About Sugar        

## 2023-01-05 ENCOUNTER — Other Ambulatory Visit: Payer: Self-pay | Admitting: Family Medicine

## 2023-01-12 ENCOUNTER — Ambulatory Visit (HOSPITAL_BASED_OUTPATIENT_CLINIC_OR_DEPARTMENT_OTHER)
Admission: RE | Admit: 2023-01-12 | Discharge: 2023-01-12 | Disposition: A | Discharge status: Home / Self Care | Payer: Medicare Other | Source: Ambulatory Visit | Attending: Cardiology | Admitting: Cardiology

## 2023-01-12 DIAGNOSIS — I2510 Atherosclerotic heart disease of native coronary artery without angina pectoris: Secondary | ICD-10-CM | POA: Diagnosis present

## 2023-01-12 LAB — ECHOCARDIOGRAM COMPLETE
Area-P 1/2: 4.83 cm2
S' Lateral: 3.4 cm

## 2023-01-24 ENCOUNTER — Other Ambulatory Visit: Payer: Self-pay | Admitting: Family Medicine

## 2023-01-29 ENCOUNTER — Other Ambulatory Visit: Payer: Self-pay | Admitting: Family Medicine

## 2023-02-07 ENCOUNTER — Other Ambulatory Visit: Payer: Self-pay | Admitting: Cardiology

## 2023-02-07 ENCOUNTER — Other Ambulatory Visit: Payer: Self-pay | Admitting: Family Medicine

## 2023-02-07 NOTE — Telephone Encounter (Signed)
Last RF done by Cardiologist

## 2023-02-07 NOTE — Telephone Encounter (Signed)
Would have them continue to refill then.

## 2023-02-24 ENCOUNTER — Other Ambulatory Visit: Payer: Self-pay | Admitting: Family Medicine

## 2023-02-27 ENCOUNTER — Other Ambulatory Visit: Payer: Self-pay | Admitting: Family Medicine

## 2023-02-27 DIAGNOSIS — Z86711 Personal history of pulmonary embolism: Secondary | ICD-10-CM

## 2023-03-08 ENCOUNTER — Ambulatory Visit (INDEPENDENT_AMBULATORY_CARE_PROVIDER_SITE_OTHER): Payer: Medicare Other | Admitting: *Deleted

## 2023-03-08 VITALS — BP 94/60 | HR 62 | Ht 69.0 in | Wt 232.2 lb

## 2023-03-08 DIAGNOSIS — Z0000 Encounter for general adult medical examination without abnormal findings: Secondary | ICD-10-CM | POA: Diagnosis not present

## 2023-03-08 NOTE — Progress Notes (Signed)
Subjective:   Thomas Carpenter is a 72 y.o. male who presents for Medicare Annual/Subsequent preventive examination.  Review of Systems     Cardiac Risk Factors include: advanced age (>77men, >77 women);dyslipidemia;hypertension;obesity (BMI >30kg/m2)     Objective:    Today's Vitals   03/08/23 1422  BP: 94/60  Pulse: 62  Weight: 232 lb 3.2 oz (105.3 kg)  Height: 5\' 9"  (1.753 m)   Body mass index is 34.29 kg/m.     03/08/2023    2:27 PM 03/04/2022    1:49 PM 02/26/2021    1:23 PM 12/14/2019    1:00 AM 12/13/2019    9:16 PM  Advanced Directives  Does Patient Have a Medical Advance Directive? Yes No Yes  No  Type of Estate agent of [place];Living will  Healthcare Power of [place];Living will    Copy of Healthcare Power of Attorney in Chart? No - copy requested  No - copy requested    Would patient like information on creating a medical advance directive?  No - Patient declined  No - Patient declined     Current Medications (verified) Outpatient Encounter Medications as of 03/08/2023  Medication Sig   allopurinol (ZYLOPRIM) 100 MG tablet Take 1 tablet (100 mg total) by mouth daily.   aspirin EC 81 MG tablet Take 1 tablet (81 mg total) by mouth daily. Swallow whole.   atorvastatin (LIPITOR) 80 MG tablet TAKE 1 TABLET BY MOUTH EVERY DAY   Cholecalciferol (VITAMIN D3) 125 MCG (5000 UT) CAPS Take 5,000 Units by mouth daily with breakfast.   clobetasol ointment (TEMOVATE) 0.05 % Apply 1 application topically as needed for rash.   colchicine 0.6 MG tablet TAKE 2 TABS AND REPEAT 1 TAB IN AN HR AND THEN TAKE 1 TAB DAILY UNTIL RESOLUTION OF FLARE.   hydrochlorothiazide (HYDRODIURIL) 12.5 MG tablet TAKE 1 TABLET BY MOUTH EVERY DAY   nitroGLYCERIN (NITROSTAT) 0.4 MG SL tablet PLACE 1 TABLET (0.4 MG TOTAL) UNDER THE TONGUE EVERY 5 (FIVE) MINUTES X 3 DOSES AS NEEDED FOR CHEST PAIN.   sacubitril-valsartan (ENTRESTO) 97-103 MG Take 1 tablet by mouth 2 (two) times daily.    spironolactone (ALDACTONE) 25 MG tablet TAKE 1/2 TABLET BY MOUTH EVERY DAY   XARELTO 20 MG TABS tablet TAKE 1 TABLET BY MOUTH DAILY WITH SUPPER   No facility-administered encounter medications on file as of 03/08/2023.    Allergies (verified) Patient has no known allergies.   History: Past Medical History:  Diagnosis Date   Actinic keratoses 09/01/2020   Acute diastolic CHF (congestive heart failure) (HCC) 07/04/2014   Acute ST elevation myocardial infarction (STEMI) due to occlusion of distal portion of left anterior descending (LAD) coronary artery (HCC) 12/14/2019   Acute ST elevation myocardial infarction (STEMI) due to occlusion of left anterior descending (LAD) coronary artery (HCC) 12/13/2019   Acute systolic congestive heart failure (HCC) 12/13/2019   Adiposity 12/03/2008   Last Assessment & Plan:  Formatting of this note might be different from the original. Obesity is improving with lifestyle modifications. Discussed the patient's BMI.  The BMI is above average; BMI management plan is completed. General weight loss/lifestyle modification strategies discussed (elicit support from others; identify saboteurs; non-food rewards, etc).   Atopic dermatitis 01/15/2015   CAD (coronary artery disease) 03/25/2022   Chronic systolic heart failure (HCC) 01/21/2020   CKD (chronic kidney disease) stage 2, GFR 60-89 ml/min 07/04/2014   CKD (chronic kidney disease) stage 4, GFR 15-29 ml/min (HCC) 12/13/2019  Colorectal polyps 04/16/2015   DVT (deep venous thrombosis) (HCC)    DVT of lower extremity, bilateral (HCC) 07/04/2014   Eczema 02/24/2000   History of pulmonary embolism 06/25/2020   Hypercoagulable state (HCC) 04/16/2016   Last Assessment & Plan:  Formatting of this note might be different from the original. Stable with apixaban   Hypertension    Hypertriglyceridemia 08/27/2014   Last Assessment & Plan:  Formatting of this note might be different from the original. Lipid abnormalities  are improving with lifestyle modifications. Nutritional counseling was provided. Lipids will be reassessed in 1 year.   Insomnia 05/09/2007   Mixed dyslipidemia 06/25/2020   Need for prophylactic vaccination and inoculation against influenza 03/25/2022   Obesity (BMI 35.0-39.9 without comorbidity) 06/25/2020   On intra-aortic balloon pump assist    Osteoarthritis of knee 11/19/2011   Last Assessment & Plan:  Formatting of this note might be different from the original. Deteriorated and proceed with referral to ortho   Other meniscus derangements, other lateral meniscus, unspecified knee 11/11/2011   Pneumonia    Pulmonary emboli [facility])    Pulmonary embolism during current hospitalization (HCC) 12/13/2019   ST elevation myocardial infarction (STEMI) [facility])    Status post coronary artery stent placement    Trapezius muscle spasm 08/21/2018   Formatting of this note might be different from the original. ROM exercises twice daily Cyclobenzaprine prn   Vitamin D deficiency 06/14/2018   Formatting of this note might be different from the original. Improved with supplement   Past Surgical History:  Procedure Laterality Date   CORONARY STENT INTERVENTION N/A 12/13/2019   Procedure: CORONARY STENT INTERVENTION;  Surgeon: Lyn Records, MD;  Location: [facility] INVASIVE CV LAB;  Service: Cardiovascular;  Laterality: N/A;   CORONARY/GRAFT ACUTE MI REVASCULARIZATION N/A 12/13/2019   Procedure: Coronary/Graft Acute MI Revascularization;  Surgeon: Lyn Records, MD;  Location: [facility] INVASIVE CV LAB;  Service: Cardiovascular;  Laterality: N/A;   IABP INSERTION N/A 12/13/2019   Procedure: IABP Insertion;  Surgeon: Lyn Records, MD;  Location: [facility] INVASIVE CV LAB;  Service: Cardiovascular;  Laterality: N/A;   LEFT HEART CATH AND CORONARY ANGIOGRAPHY N/A 12/13/2019   Procedure: LEFT HEART CATH AND CORONARY ANGIOGRAPHY;  Surgeon: Lyn Records, MD;  Location: [facility] INVASIVE CV LAB;  Service: Cardiovascular;  Laterality:  N/A;   Family History  Problem Relation Age of Onset   Kidney disease Mother    Cancer Mother    Hypertension Father    Heart attack Paternal Uncle    Diabetes Neg Hx    Heart disease Neg Hx    Social History   Socioeconomic History   Marital status: Single    Spouse name: Not on file   Number of children: Not on file   Years of education: Not on file   Highest education level: Not on file  Occupational History   Occupation: retired  Tobacco Use   Smoking status: Never   Smokeless tobacco: Current    Types: Chew  Vaping Use   Vaping Use: Never used  Substance and Sexual Activity   Alcohol use: Yes    Comment: daily   Drug use: Never   Sexual activity: Not on file  Other Topics Concern   Not on file  Social History Narrative   Not on file   Social Determinants of Health   Financial Resource Strain: Low Risk  (03/04/2022)   Overall Financial Resource Strain (CARDIA)    Difficulty of Paying Living Expenses:  Not hard at all  Food Insecurity: No Food Insecurity (03/08/2023)   Hunger Vital Sign    Worried About Running Out of Food in the Last Year: Never true    Ran Out of Food in the Last Year: Never true  Transportation Needs: No Transportation Needs (03/08/2023)   PRAPARE - Administrator, Civil Service (Medical): No    Lack of Transportation (Non-Medical): No  Physical Activity: Sufficiently Active (03/04/2022)   Exercise Vital Sign    Days of Exercise per Week: 7 days    Minutes of Exercise per Session: 60 min  Stress: No Stress Concern Present (03/04/2022)   [employer] of Occupational Health - Occupational Stress Questionnaire    Feeling of Stress : Not at all  Social Connections: Socially Isolated (03/04/2022)   Social Connection and Isolation Panel [NHANES]    Frequency of Communication with Friends and Family: More than three times a week    Frequency of Social Gatherings with Friends and Family: More than three times a week    Attends Religious  Services: Never    Database administrator or Organizations: No    Attends Engineer, structural: Never    Marital Status: Divorced    Tobacco Counseling Ready to quit: Not Answered Counseling given: Not Answered   Clinical Intake:  Pre-visit preparation completed: Yes  Pain : No/denies pain  BMI - recorded: 34.29 Nutritional Status: BMI > 30  Obese Nutritional Risks: None Diabetes: No  How often do you need to have someone help you when you read instructions, pamphlets, or other written materials from your doctor or pharmacy?: 1 - Never   Activities of Daily Living    03/08/2023    2:28 PM  In your present state of health, do you have any difficulty performing the following activities:  Hearing? 0  Vision? 0  Difficulty concentrating or making decisions? 0  Walking or climbing stairs? 0  Dressing or bathing? 0  Doing errands, shopping? 0  Preparing Food and eating ? N  Using the Toilet? N  In the past six months, have you accidently leaked urine? N  Do you have problems with loss of bowel control? N  Managing your Medications? N  Managing your Finances? N  Housekeeping or managing your Housekeeping? N    Patient Care Team: Sharlene Dory, DO as PCP - General (Family Medicine) Bensimhon, Bevelyn Buckles, MD as PCP - Advanced Heart Failure (Cardiology)  Indicate any recent Medical Services you may have received from other than [facility] providers in the past year (date may be approximate).     Assessment:   This is a routine wellness examination for Thomas Carpenter.  Hearing/Vision screen No results found.  Dietary issues and exercise activities discussed: Current Exercise Habits: Home exercise routine, Type of exercise: walking;Other - see comments (rides bike), Time (Minutes): 60, Frequency (Times/Week): 5, Weekly Exercise (Minutes/Week): 300, Intensity: Moderate, Exercise limited by: None identified   Goals Addressed   None    Depression Screen     03/08/2023    2:34 PM 03/04/2022    1:52 PM 02/26/2021    1:25 PM  PHQ 2/9 Scores  PHQ - 2 Score 0 0 0    Fall Risk    03/08/2023    2:34 PM 11/01/2022    3:24 PM 03/04/2022    1:52 PM 02/26/2021    1:24 PM  Fall Risk   Falls in the past year? 0 0 0 0  Number  falls in past yr: 0 0 0 0  Injury with Fall? 0 0 0 0  Risk for fall due to : No Fall Risks  No Fall Risks   Follow up Falls evaluation completed  Falls evaluation completed Falls prevention discussed    FALL RISK PREVENTION PERTAINING TO THE HOME:  Any stairs in or around the home? Yes  If so, are there any without handrails? No  Home free of loose throw rugs in walkways, pet beds, electrical cords, etc? Yes  Adequate lighting in your home to reduce risk of falls? Yes   ASSISTIVE DEVICES UTILIZED TO PREVENT FALLS:  Life alert? No  Use of a cane, walker or w/c? No  Grab bars in the bathroom? No  Shower chair or bench in shower? No  Elevated toilet seat or a handicapped toilet? No   TIMED UP AND GO:  Was the test performed? Yes .  Length of time to ambulate 10 feet: 7 sec.   Gait steady and fast without use of assistive device  Cognitive Function:        03/08/2023    2:35 PM 03/04/2022    1:56 PM  6CIT Screen  What Year? 0 points 0 points  What month? 3 points 0 points  What time? 0 points 0 points  Count back from 20 0 points 0 points  Months in reverse 0 points 0 points  Repeat phrase 0 points 0 points  Total Score 3 points 0 points    Immunizations Immunization History  Administered Date(s) Administered   Influenza, High Dose Seasonal PF 07/02/2020, 08/23/2022   Influenza-Unspecified 08/11/2021   Moderna SARS-COV2 Booster Vaccination 08/23/2022   PFIZER(Purple Top)SARS-COV-2 Vaccination 11/08/2019, 11/29/2019, 09/01/2020   PNEUMOCOCCAL CONJUGATE-20 12/03/2022   Pfizer Covid-19 Vaccine Bivalent Booster 59yrs & up 09/11/2021   RSV,unspecified 08/23/2022   Tdap 12/02/2020    TDAP status: Up to  date  Flu Vaccine status: Up to date  Pneumococcal vaccine status: Up to date  Covid-19 vaccine status: Information provided on how to obtain vaccines.   Qualifies for Shingles Vaccine? Yes   Zostavax completed No   Shingrix Completed?: No.    Education has been provided regarding the importance of this vaccine. Patient has been advised to call insurance company to determine out of pocket expense if they have not yet received this vaccine. Advised may also receive vaccine at local pharmacy or Health Dept. Verbalized acceptance and understanding.  Screening Tests Health Maintenance  Topic Date Due   Hepatitis C Screening  Never done   Medicare Annual Wellness (AWV)  03/05/2023   COVID-19 Vaccine (5 - 2023-24 season) 05/02/2023 (Originally 10/18/2022)   Zoster Vaccines- Shingrix (1 of 2) 05/02/2023 (Originally 08/28/1970)   INFLUENZA VACCINE  05/05/2023   Colonoscopy  04/15/2025   DTaP/Tdap/Td (2 - Td or Tdap) 12/03/2030   Pneumonia Vaccine 69+ Years old  Completed   HPV VACCINES  Aged Out    Health Maintenance  Health Maintenance Due  Topic Date Due   Hepatitis C Screening  Never done   Medicare Annual Wellness (AWV)  03/05/2023    Colorectal cancer screening: Type of screening: Colonoscopy. Completed 04/16/15. Repeat every 10 years  Lung Cancer Screening: (Low Dose CT Chest recommended if Age 56-80 years, 30 pack-year currently smoking OR have quit w/in 15years.) does not qualify.   Additional Screening:  Hepatitis C Screening: does qualify; Completed N/a  Vision Screening: Recommended annual ophthalmology exams for early detection of glaucoma and other disorders of the  eye. Is the patient up to date with their annual eye exam?  Yes  Who is the provider or what is the name of the office in which the patient attends annual eye exams? [facility]. If pt is not established with a provider, would they like to be referred to a provider to establish care? No .   Dental  Screening: Recommended annual dental exams for proper oral hygiene  Community Resource Referral / Chronic Care Management: CRR required this visit?  No   CCM required this visit?  No      Plan:     I have personally reviewed and noted the following in the patient's chart:   Medical and social history Use of alcohol, tobacco or illicit drugs  Current medications and supplements including opioid prescriptions. Patient is not currently taking opioid prescriptions. Functional ability and status Nutritional status Physical activity Advanced directives List of other physicians Hospitalizations, surgeries, and ER visits in previous 12 months Vitals Screenings to include cognitive, depression, and falls Referrals and appointments  In addition, I have reviewed and discussed with patient certain preventive protocols, quality metrics, and best practice recommendations. A written personalized care plan for preventive services as well as general preventive health recommendations were provided to patient.     Donne Anon, [place]   03/08/2023   Nurse Notes: None

## 2023-03-08 NOTE — Patient Instructions (Signed)
Mr. Thomas Carpenter , Thank you for taking time to come for your Medicare Wellness Visit. I appreciate your ongoing commitment to your health goals. Please review the following plan we discussed and let me know if I can assist you in the future.   These are the goals we discussed:  Goals      Patient Stated     Maintain current health        This is a list of the screening recommended for you and due dates:  Health Maintenance  Topic Date Due   Hepatitis C Screening  Never done   COVID-19 Vaccine (5 - 2023-24 season) 05/02/2023*   Zoster (Shingles) Vaccine (1 of 2) 05/02/2023*   Flu Shot  05/05/2023   Medicare Annual Wellness Visit  03/07/2024   Colon Cancer Screening  04/15/2025   DTaP/Tdap/Td vaccine (2 - Td or Tdap) 12/03/2030   Pneumonia Vaccine  Completed   HPV Vaccine  Aged Out  *Topic was postponed. The date shown is not the original due date.     Next appointment: Follow up in one year for your annual wellness visit.   Preventive Care 71 Years and Older, Male Preventive care refers to lifestyle choices and visits with your health care provider that can promote health and wellness. What does preventive care include? A yearly physical exam. This is also called an annual well check. Dental exams once or twice a year. Routine eye exams. Ask your health care provider how often you should have your eyes checked. Personal lifestyle choices, including: Daily care of your teeth and gums. Regular physical activity. Eating a healthy diet. Avoiding tobacco and drug use. Limiting alcohol use. Practicing safe sex. Taking low doses of aspirin every day. Taking vitamin and mineral supplements as recommended by your health care provider. What happens during an annual well check? The services and screenings done by your health care provider during your annual well check will depend on your age, overall health, lifestyle risk factors, and family history of disease. Counseling  Your  health care provider may ask you questions about your: Alcohol use. Tobacco use. Drug use. Emotional well-being. Home and relationship well-being. Sexual activity. Eating habits. History of falls. Memory and ability to understand (cognition). Work and work Astronomer. Screening  You may have the following tests or measurements: Height, weight, and BMI. Blood pressure. Lipid and cholesterol levels. These may be checked every 5 years, or more frequently if you are over 22 years old. Skin check. Lung cancer screening. You may have this screening every year starting at age 14 if you have a 30-pack-year history of smoking and currently smoke or have quit within the past 15 years. Fecal occult blood test (FOBT) of the stool. You may have this test every year starting at age 14. Flexible sigmoidoscopy or colonoscopy. You may have a sigmoidoscopy every 5 years or a colonoscopy every 10 years starting at age 55. Prostate cancer screening. Recommendations will vary depending on your family history and other risks. Hepatitis C blood test. Hepatitis B blood test. Sexually transmitted disease (STD) testing. Diabetes screening. This is done by checking your blood sugar (glucose) after you have not eaten for a while (fasting). You may have this done every 1-3 years. Abdominal aortic aneurysm (AAA) screening. You may need this if you are a current or former smoker. Osteoporosis. You may be screened starting at age 72 if you are at high risk. Talk with your health care provider about your test results, treatment options,  and if necessary, the need for more tests. Vaccines  Your health care provider may recommend certain vaccines, such as: Influenza vaccine. This is recommended every year. Tetanus, diphtheria, and acellular pertussis (Tdap, Td) vaccine. You may need a Td booster every 10 years. Zoster vaccine. You may need this after age 9. Pneumococcal 13-valent conjugate (PCV13) vaccine. One dose  is recommended after age 66. Pneumococcal polysaccharide (PPSV23) vaccine. One dose is recommended after age 7. Talk to your health care provider about which screenings and vaccines you need and how often you need them. This information is not intended to replace advice given to you by your health care provider. Make sure you discuss any questions you have with your health care provider. Document Released: 10/17/2015 Document Revised: 06/09/2016 Document Reviewed: 07/22/2015 Elsevier Interactive Patient Education  2017 [employer].  Fall Prevention in the Home Falls can cause injuries. They can happen to people of all ages. There are many things you can do to make your home safe and to help prevent falls. What can I do on the outside of my home? Regularly fix the edges of walkways and driveways and fix any cracks. Remove anything that might make you trip as you walk through a door, such as a raised step or threshold. Trim any bushes or trees on the path to your home. Use bright outdoor lighting. Clear any walking paths of anything that might make someone trip, such as rocks or tools. Regularly check to see if handrails are loose or broken. Make sure that both sides of any steps have handrails. Any raised decks and porches should have guardrails on the edges. Have any leaves, snow, or ice cleared regularly. Use sand or salt on walking paths during winter. Clean up any spills in your garage right away. This includes oil or grease spills. What can I do in the bathroom? Use night lights. Install grab bars by the toilet and in the tub and shower. Do not use towel bars as grab bars. Use non-skid mats or decals in the tub or shower. If you need to sit down in the shower, use a plastic, non-slip stool. Keep the floor dry. Clean up any water that spills on the floor as soon as it happens. Remove soap buildup in the tub or shower regularly. Attach bath mats securely with double-sided non-slip rug  tape. Do not have throw rugs and other things on the floor that can make you trip. What can I do in the bedroom? Use night lights. Make sure that you have a light by your bed that is easy to reach. Do not use any sheets or blankets that are too big for your bed. They should not hang down onto the floor. Have a firm chair that has side arms. You can use this for support while you get dressed. Do not have throw rugs and other things on the floor that can make you trip. What can I do in the kitchen? Clean up any spills right away. Avoid walking on wet floors. Keep items that you use a lot in easy-to-reach places. If you need to reach something above you, use a strong step stool that has a grab bar. Keep electrical cords out of the way. Do not use floor polish or wax that makes floors slippery. If you must use wax, use non-skid floor wax. Do not have throw rugs and other things on the floor that can make you trip. What can I do with my stairs? Do not leave  any items on the stairs. Make sure that there are handrails on both sides of the stairs and use them. Fix handrails that are broken or loose. Make sure that handrails are as long as the stairways. Check any carpeting to make sure that it is firmly attached to the stairs. Fix any carpet that is loose or worn. Avoid having throw rugs at the top or bottom of the stairs. If you do have throw rugs, attach them to the floor with carpet tape. Make sure that you have a light switch at the top of the stairs and the bottom of the stairs. If you do not have them, ask someone to add them for you. What else can I do to help prevent falls? Wear shoes that: Do not have high heels. Have rubber bottoms. Are comfortable and fit you well. Are closed at the toe. Do not wear sandals. If you use a stepladder: Make sure that it is fully opened. Do not climb a closed stepladder. Make sure that both sides of the stepladder are locked into place. Ask someone to  hold it for you, if possible. Clearly mark and make sure that you can see: Any grab bars or handrails. First and last steps. Where the edge of each step is. Use tools that help you move around (mobility aids) if they are needed. These include: Canes. Walkers. Scooters. Crutches. Turn on the lights when you go into a dark area. Replace any light bulbs as soon as they burn out. Set up your furniture so you have a clear path. Avoid moving your furniture around. If any of your floors are uneven, fix them. If there are any pets around you, be aware of where they are. Review your medicines with your doctor. Some medicines can make you feel dizzy. This can increase your chance of falling. Ask your doctor what other things that you can do to help prevent falls. This information is not intended to replace advice given to you by your health care provider. Make sure you discuss any questions you have with your health care provider. Document Released: 07/17/2009 Document Revised: 02/26/2016 Document Reviewed: 10/25/2014 Elsevier Interactive Patient Education  2017 [employer].

## 2023-03-09 ENCOUNTER — Other Ambulatory Visit: Payer: Self-pay | Admitting: Cardiology

## 2023-03-09 NOTE — Telephone Encounter (Signed)
Refill sent.

## 2023-04-01 ENCOUNTER — Other Ambulatory Visit: Payer: Self-pay | Admitting: Family Medicine

## 2023-05-05 ENCOUNTER — Other Ambulatory Visit: Payer: Self-pay | Admitting: Family Medicine

## 2023-05-25 ENCOUNTER — Other Ambulatory Visit: Payer: Self-pay | Admitting: Family Medicine

## 2023-06-04 ENCOUNTER — Other Ambulatory Visit: Payer: Self-pay | Admitting: Family Medicine

## 2023-07-07 ENCOUNTER — Other Ambulatory Visit: Payer: Self-pay | Admitting: Family Medicine

## 2023-08-03 ENCOUNTER — Other Ambulatory Visit: Payer: Self-pay | Admitting: Cardiology

## 2023-08-03 ENCOUNTER — Other Ambulatory Visit: Payer: Self-pay | Admitting: Family Medicine

## 2023-08-30 ENCOUNTER — Other Ambulatory Visit: Payer: Self-pay | Admitting: Family Medicine

## 2023-08-31 ENCOUNTER — Other Ambulatory Visit: Payer: Self-pay | Admitting: Family Medicine

## 2023-09-08 ENCOUNTER — Other Ambulatory Visit: Payer: Self-pay | Admitting: Family Medicine

## 2023-09-08 DIAGNOSIS — Z86711 Personal history of pulmonary embolism: Secondary | ICD-10-CM

## 2023-09-29 ENCOUNTER — Other Ambulatory Visit: Payer: Self-pay | Admitting: Family Medicine

## 2023-10-30 ENCOUNTER — Other Ambulatory Visit: Payer: Self-pay | Admitting: Cardiology

## 2023-10-30 ENCOUNTER — Other Ambulatory Visit: Payer: Self-pay | Admitting: Family Medicine

## 2023-11-28 ENCOUNTER — Other Ambulatory Visit: Payer: Self-pay | Admitting: Family Medicine

## 2023-11-29 ENCOUNTER — Other Ambulatory Visit: Payer: Self-pay | Admitting: Family Medicine

## 2023-12-07 ENCOUNTER — Ambulatory Visit: Payer: Medicare Other | Admitting: Family Medicine

## 2023-12-07 ENCOUNTER — Encounter: Payer: Self-pay | Admitting: Family Medicine

## 2023-12-07 VITALS — BP 107/63 | HR 59 | Temp 98.1°F | Resp 12 | Ht 69.0 in | Wt 237.8 lb

## 2023-12-07 DIAGNOSIS — E782 Mixed hyperlipidemia: Secondary | ICD-10-CM

## 2023-12-07 DIAGNOSIS — I10 Essential (primary) hypertension: Secondary | ICD-10-CM

## 2023-12-07 DIAGNOSIS — M1A0710 Idiopathic chronic gout, right ankle and foot, without tophus (tophi): Secondary | ICD-10-CM

## 2023-12-07 HISTORY — DX: Idiopathic chronic gout, right ankle and foot, without tophus (tophi): M1A.0710

## 2023-12-07 LAB — LIPID PANEL
Cholesterol: 101 mg/dL (ref 0–200)
HDL: 49.4 mg/dL (ref >39.00)
LDL Cholesterol: 40 mg/dL (ref 0–99)
NonHDL: 51.85
Total CHOL/HDL Ratio: 2
Triglycerides: 57 mg/dL (ref 0.0–149.0)
VLDL: 11.4 mg/dL (ref 0.0–40.0)

## 2023-12-07 LAB — COMPREHENSIVE METABOLIC PANEL
ALT: 16 U/L (ref 0–53)
AST: 17 U/L (ref 0–37)
Albumin: 4 g/dL (ref 3.5–5.2)
Alkaline Phosphatase: 71 U/L (ref 39–117)
BUN: 35 mg/dL — ABNORMAL HIGH (ref 6–23)
CO2: 27 meq/L (ref 19–32)
Calcium: 9 mg/dL (ref 8.4–10.5)
Chloride: 102 meq/L (ref 96–112)
Creatinine, Ser: 1.59 mg/dL — ABNORMAL HIGH (ref 0.40–1.50)
GFR: 43.48 mL/min — ABNORMAL LOW (ref >60.00)
Glucose, Bld: 101 mg/dL — ABNORMAL HIGH (ref 70–99)
Potassium: 4.5 meq/L (ref 3.5–5.1)
Sodium: 136 meq/L (ref 135–145)
Total Bilirubin: 0.5 mg/dL (ref 0.2–1.2)
Total Protein: 6.4 g/dL (ref 6.0–8.3)

## 2023-12-07 LAB — URIC ACID: Uric Acid, Serum: 7.5 mg/dL (ref 4.0–7.8)

## 2023-12-07 MED ORDER — COLCHICINE 0.6 MG PO TABS: ORAL_TABLET | ORAL | 1 refills | Status: AC

## 2023-12-07 NOTE — Progress Notes (Signed)
 Chief Complaint  Patient presents with   annual follow up    Macguire Holsinger is a 73 y.o. male here for gout.  Currently being treated with Allopurinol 300 mg/d. The joint(s) affected include: R foot Most recent uric acid level is: 6.8 Reports compliance, no AE's. No gout flares since we saw each other last.  Side effects of medications: None Is avoiding seafood, sweet/sugary beverages, alcohol, and red meats.  Hypertension Patient presents for hypertension follow up. He does monitor home blood pressures. He is compliant with medications- hydrochlorothiazide 12.5 mg/d, Entresto/aldactone from cards. Patient has these side effects of medication: none He is usually adhering to a healthy diet overall. Exercise: walking, resistance bands, cycling  No CP or SOB.  Hyperlipidemia Patient presents for mixed dyslipidemia follow up. Currently being treated with Lipitor 80 mg/d and compliance with treatment thus far has been good. He denies myalgias. Diet/exercise as above.  The patient is known to have coexisting coronary artery disease.  Past Medical History:  Diagnosis Date   Actinic keratoses 09/01/2020   Acute diastolic CHF (congestive heart failure) (HCC) 07/04/2014   Acute ST elevation myocardial infarction (STEMI) due to occlusion of distal portion of left anterior descending (LAD) coronary artery (HCC) 12/14/2019   Acute ST elevation myocardial infarction (STEMI) due to occlusion of left anterior descending (LAD) coronary artery (HCC) 12/13/2019   Acute systolic congestive heart failure (HCC) 12/13/2019   Adiposity 12/03/2008   Last Assessment & Plan:  Formatting of this note might be different from the original. Obesity is improving with lifestyle modifications. Discussed the patient's BMI.  The BMI is above average; BMI management plan is completed. General weight loss/lifestyle modification strategies discussed (elicit support from others; identify saboteurs; non-food rewards,  etc).   Atopic dermatitis 01/15/2015   CAD (coronary artery disease) 03/25/2022   Chronic systolic heart failure (HCC) 01/21/2020   CKD (chronic kidney disease) stage 2, GFR 60-89 ml/min 07/04/2014   CKD (chronic kidney disease) stage 4, GFR 15-29 ml/min (HCC) 12/13/2019   Colorectal polyps 04/16/2015   DVT (deep venous thrombosis) (HCC)    DVT of lower extremity, bilateral (HCC) 07/04/2014   Eczema 02/24/2000   History of pulmonary embolism 06/25/2020   Hypercoagulable state (HCC) 04/16/2016   Last Assessment & Plan:  Formatting of this note might be different from the original. Stable with apixaban   Hypertension    Hypertriglyceridemia 08/27/2014   Last Assessment & Plan:  Formatting of this note might be different from the original. Lipid abnormalities are improving with lifestyle modifications. Nutritional counseling was provided. Lipids will be reassessed in 1 year.   Insomnia 05/09/2007   Mixed dyslipidemia 06/25/2020   Need for prophylactic vaccination and inoculation against influenza 03/25/2022   Obesity (BMI 35.0-39.9 without comorbidity) 06/25/2020   On intra-aortic balloon pump assist    Osteoarthritis of knee 11/19/2011   Last Assessment & Plan:  Formatting of this note might be different from the original. Deteriorated and proceed with referral to ortho   Other meniscus derangements, other lateral meniscus, unspecified knee 11/11/2011   Pneumonia    Pulmonary emboli [facility])    Pulmonary embolism during current hospitalization (HCC) 12/13/2019   ST elevation myocardial infarction (STEMI) [facility])    Status post coronary artery stent placement    Trapezius muscle spasm 08/21/2018   Formatting of this note might be different from the original. ROM exercises twice daily Cyclobenzaprine prn   Vitamin D deficiency 06/14/2018   Formatting of this note might be different from  the original. Improved with supplement    BP 107/63 (BP Location: Right Arm, Patient Position: Sitting)    Pulse (!) 59   Temp 98.1 F (36.7 C) (Oral)   Resp 12   Ht 5\' 9"  (1.753 m)   Wt 237 lb 12.8 oz (107.9 kg)   SpO2 100%   BMI 35.12 kg/m  Gen: Awake, alert, appears stated age Neck: No masses or asymmetry Heart: RRR, no bruits, 1+ pitting bilateral LE edema tapering at the mid tibia Lungs: CTAB, no accessory muscle use Neuro: Gait is normal for him Psych: Age appropriate judgment and insight, nml mood and affect  Chronic gout of right foot, unspecified cause - Plan: Uric acid  Mixed dyslipidemia - Plan: Comprehensive metabolic panel, Lipid panel  Essential hypertension  Chronic, stable.  Check uric acid.  Continue allopurinol 300 mg daily.  Continue colchicine as needed for flares. Chronic, stable.  Continue hydrochlorothiazide 12.5 mg daily, Entresto and spironolactone as ordered by the cardiology team. Chronic, stable.  Continue Lipitor 80 mg daily.  Counseled on diet and exercise. Shingrix recommended.  Follow-up in 1 year or as needed. The patient voiced understanding and agreement to the plan.  Jilda Roche [place], DO 12/07/23 12:56 PM

## 2023-12-07 NOTE — Patient Instructions (Signed)
 Give [place] 2-3 business days to get the results of your labs back.   Keep the diet clean and stay active.  Please get me a copy of your advanced directive form at your convenience.   Let us know if you need anything.

## 2023-12-08 ENCOUNTER — Other Ambulatory Visit: Payer: Self-pay | Admitting: Cardiology

## 2023-12-08 ENCOUNTER — Encounter: Payer: Self-pay | Admitting: Family Medicine

## 2023-12-08 NOTE — Telephone Encounter (Signed)
 Prescription sent to pharmacy.

## 2023-12-09 ENCOUNTER — Telehealth: Payer: Self-pay | Admitting: Cardiology

## 2023-12-09 MED ORDER — SACUBITRIL-VALSARTAN 97-103 MG PO TABS: 1.0000 | ORAL_TABLET | Freq: Two times a day (BID) | ORAL | 0 refills | Status: DC

## 2023-12-09 NOTE — Telephone Encounter (Signed)
 Spoke with patient notified medication sent.

## 2023-12-09 NOTE — Telephone Encounter (Signed)
*  STAT* If patient is at the pharmacy, call can be transferred to refill team.   1. Which medications need to be refilled? (please list name of each medication and dose if known)   sacubitril-valsartan (ENTRESTO) 97-103 MG    2. Which pharmacy/location (including street and city if local pharmacy) is medication to be sent to?  [id number] - [place], [place] - [address]   3. Do they need a 30 day or 90 day supply? 30  Patient has appt on 01/05/2023 with Dr. Tomie China

## 2023-12-16 ENCOUNTER — Ambulatory Visit (INDEPENDENT_AMBULATORY_CARE_PROVIDER_SITE_OTHER): Admitting: Medical

## 2023-12-16 ENCOUNTER — Other Ambulatory Visit (HOSPITAL_BASED_OUTPATIENT_CLINIC_OR_DEPARTMENT_OTHER): Payer: Self-pay

## 2023-12-16 ENCOUNTER — Ambulatory Visit: Admitting: Medical

## 2023-12-16 VITALS — BP 102/64 | HR 73 | Temp 98.4°F | Resp 18 | Ht 69.0 in | Wt 230.0 lb

## 2023-12-16 DIAGNOSIS — U071 COVID-19: Secondary | ICD-10-CM

## 2023-12-16 DIAGNOSIS — R059 Cough, unspecified: Secondary | ICD-10-CM | POA: Diagnosis not present

## 2023-12-16 DIAGNOSIS — R0981 Nasal congestion: Secondary | ICD-10-CM

## 2023-12-16 MED ORDER — FLUTICASONE PROPIONATE 50 MCG/ACT NA SUSP: 2.0000 | Freq: Every day | NASAL | 1 refills | Status: AC

## 2023-12-16 MED ORDER — MOLNUPIRAVIR EUA 200MG CAPSULE: 4.0000 | ORAL_CAPSULE | Freq: Two times a day (BID) | ORAL | 0 refills | Status: AC

## 2023-12-16 MED ORDER — BENZONATATE 100 MG PO CAPS: 100.0000 mg | ORAL_CAPSULE | Freq: Three times a day (TID) | ORAL | 0 refills | Status: AC | PRN

## 2023-12-16 NOTE — Progress Notes (Signed)
   Subjective:    Patient ID: Thomas Carpenter, male    DOB: 01/21/1951, 73 y.o.   MRN: [id number]  HPI  Thomas Carpenter is a 73 year old male with atrial fibrillation who presents with COVID-19 symptoms.  He tested positive for COVID-19 after experiencing symptoms that began the previous night, including a stuffy nose, cough, sneezing, and a general feeling of malaise, described as feeling 'hung over.' He also has fatigue and diffuse body aches, which are slightly worse than his baseline. No wheezing, shortness of breath, or sinus pain. He is currently on day two of symptoms and has not had COVID-19 before but has received all available vaccines and boosters.  He has a history of atrial fibrillation and is currently taking Xarelto. He mentions that his blood pressure has been lower in recent years, attributed to weight loss, exercise, and medication. His blood pressure was checked last week and was similar to today's reading.  Review of Systems See hpi    Objective:   Physical Exam  General Mental Status- Alert. General Appearance- Not in acute distress.   Skin General: Color- Normal Color. Moisture- Normal Moisture.  Neck No JVD.  Chest and Lung Exam Auscultation: Breath Sounds:-CTA  Cardiovascular Auscultation:Rythm- RRR Murmurs & Other Heart Sounds:Auscultation of the heart reveals- No Murmurs.  Abdomen  Palpation/Percussion:Note:No mass. Palpation and Percussion of the abdomen reveal- Non Tender, Non Distended + BS, no rebound or guarding.    Neurologic CN iii-12 grossly intact.  Heent- no sinus pressure. Canals clear and norma tm. Normal posterior pharynx.  Lower ext- calfs symmetric, negative homans signs, no pedal edema.    Assessment & Plan:   Patient Instructions  COVID-19 infection Symptom onset two days ago with nasal congestion, cough, sneezing, fatigue, and diffuse myalgia. Oxygen saturation is 97%. Fully vaccinated. Antiviral treatment considered due to  age and moderate symptoms. Paxlovid contraindicated due to interaction with Xarelto and renal function. Molnupiravir is an alternative. Discussed benefits and risks of antivirals, noting severe side effects are uncommon but medication is under emergency use authorization. - Prescribe molnupiravir if symptoms persist or worsen over the next three days. - Prescribe Flonase nasal spray for nasal congestion. - Prescribe benzonatate for cough. - Monitor for secondary sinus or lung infection; prescribe antibiotics if symptoms of sinusitis or bronchitis/pneumonia develop. - Advise to report any wheezing or dyspnea.  Atrial fibrillation Managed with Xarelto. Interaction with Paxlovid necessitates alternative antiviral treatment for COVID-19. Consulted with pharmacist to confirm interaction and alternative options.   Follow-up Plan to monitor COVID-19 symptoms and potential complications. - Follow up in 7-10 days with primary care provider or sooner if needed.   Esperanza Richters, PA-C

## 2023-12-16 NOTE — Patient Instructions (Signed)
 COVID-19 infection Symptom onset two days ago with nasal congestion, cough, sneezing, fatigue, and diffuse myalgia. Oxygen saturation is 97%. Fully vaccinated. Antiviral treatment considered due to age and moderate symptoms. Paxlovid contraindicated due to interaction with Xarelto and renal function. Molnupiravir is an alternative. Discussed benefits and risks of antivirals, noting severe side effects are uncommon but medication is under emergency use authorization. - Prescribe molnupiravir if symptoms persist or worsen over the next three days. - Prescribe Flonase nasal spray for nasal congestion. - Prescribe benzonatate for cough. - Monitor for secondary sinus or lung infection; prescribe antibiotics if symptoms of sinusitis or bronchitis/pneumonia develop. - Advise to report any wheezing or dyspnea.  Atrial fibrillation Managed with Xarelto. Interaction with Paxlovid necessitates alternative antiviral treatment for COVID-19. Consulted with pharmacist to confirm interaction and alternative options.   Follow-up Plan to monitor COVID-19 symptoms and potential complications. - Follow up in 7-10 days with primary care provider or sooner if needed.

## 2023-12-27 ENCOUNTER — Other Ambulatory Visit: Payer: Self-pay | Admitting: Cardiology

## 2023-12-27 ENCOUNTER — Other Ambulatory Visit: Payer: Self-pay | Admitting: Family Medicine

## 2023-12-28 ENCOUNTER — Other Ambulatory Visit: Payer: Self-pay | Admitting: Family Medicine

## 2024-01-03 DIAGNOSIS — I10 Essential (primary) hypertension: Secondary | ICD-10-CM | POA: Insufficient documentation

## 2024-01-04 ENCOUNTER — Other Ambulatory Visit: Payer: Self-pay | Admitting: Cardiology

## 2024-01-05 ENCOUNTER — Encounter: Payer: Self-pay | Admitting: Cardiology

## 2024-01-05 ENCOUNTER — Ambulatory Visit: Attending: Cardiology | Admitting: Cardiology

## 2024-01-05 VITALS — BP 90/58 | HR 68 | Ht 69.0 in | Wt 234.0 lb

## 2024-01-05 DIAGNOSIS — Z86711 Personal history of pulmonary embolism: Secondary | ICD-10-CM | POA: Insufficient documentation

## 2024-01-05 DIAGNOSIS — I2510 Atherosclerotic heart disease of native coronary artery without angina pectoris: Secondary | ICD-10-CM | POA: Diagnosis not present

## 2024-01-05 DIAGNOSIS — I10 Essential (primary) hypertension: Secondary | ICD-10-CM | POA: Diagnosis present

## 2024-01-05 DIAGNOSIS — N184 Chronic kidney disease, stage 4 (severe): Secondary | ICD-10-CM | POA: Diagnosis present

## 2024-01-05 DIAGNOSIS — E669 Obesity, unspecified: Secondary | ICD-10-CM | POA: Insufficient documentation

## 2024-01-05 DIAGNOSIS — Z955 Presence of coronary angioplasty implant and graft: Secondary | ICD-10-CM | POA: Diagnosis present

## 2024-01-05 NOTE — Progress Notes (Signed)
 Cardiology Office Note:    Date:  01/05/2024   ID:  Thomas Carpenter, DOB June 05, 1951, MRN [id number]  PCP:  Sharlene Dory, DO  Cardiologist:  Garwin Brothers, MD   Referring MD: Sharlene Dory*    ASSESSMENT:    1. Coronary artery disease involving native coronary artery of native heart without angina pectoris   2. Essential hypertension   3. Obesity (BMI 35.0-39.9 without comorbidity)   4. Status post coronary artery stent placement   5. History of pulmonary embolism   6. CKD (chronic kidney disease) stage 4, GFR 15-29 ml/min (HCC)    PLAN:    In order of problems listed above:  Coronary artery disease: Secondary prevention stressed with the patient.  Importance of compliance with diet medication stressed and he vocalized understanding.  I congratulated him about excellent secondary prevention practices. Essential hypertension: Blood pressure is borderline so I have discontinued Aldactone.  I am a little concerned about the summer weather and his blood pressure dropping even further. History of cardiomyopathy: On guideline directed medical therapy.  Last ejection fraction was within normal limits. Mixed dyslipidemia: On lipid-lowering medications followed by primary care.  KPN sheet reviewed and lipids are at goal. Obesity: Weight reduction stressed diet emphasized and risks of obesity explained. History of pulmonary embolism and renal insufficiency: Appears to be stable.  On anticoagulation.  Benefits risks explained.  Followed and managed by primary care for this aspect. Patient will be seen in follow-up appointment in 6 months or earlier if the patient has any concerns.    Medication Adjustments/Labs and Tests Ordered: Current medicines are reviewed at length with the patient today.  Concerns regarding medicines are outlined above.  Orders Placed This Encounter  Procedures   EKG 12-Lead   No orders of the defined types were placed in this  encounter.    No chief complaint on file.    History of Present Illness:    Thomas Carpenter is a 73 y.o. male.  Patient has past medical history of coronary artery disease, essential hypertension, dyslipidemia, diabetes mellitus and obesity.  He denies any problems at this time and takes care of activities of daily living.  He mentions to me that he walks half an hour a day on a daily basis.  He is on anticoagulation for history of pulmonary embolism.  At the time of my evaluation, the patient is alert awake oriented and in no distress.  Past Medical History:  Diagnosis Date   Actinic keratoses 09/01/2020   Acute diastolic CHF (congestive heart failure) (HCC) 07/04/2014   Acute ST elevation myocardial infarction (STEMI) due to occlusion of distal portion of left anterior descending (LAD) coronary artery (HCC) 12/14/2019   Acute ST elevation myocardial infarction (STEMI) due to occlusion of left anterior descending (LAD) coronary artery (HCC) 12/13/2019   Acute systolic congestive heart failure (HCC) 12/13/2019   Adiposity 12/03/2008   Last Assessment & Plan:  Formatting of this note might be different from the original. Obesity is improving with lifestyle modifications. Discussed the patient's BMI.  The BMI is above average; BMI management plan is completed. General weight loss/lifestyle modification strategies discussed (elicit support from others; identify saboteurs; non-food rewards, etc).   Atopic dermatitis 01/15/2015   CAD (coronary artery disease) 03/25/2022   Chronic gout of right foot 12/07/2023   Chronic systolic heart failure (HCC) 01/21/2020   CKD (chronic kidney disease) stage 2, GFR 60-89 ml/min 07/04/2014   CKD (chronic kidney disease) stage 4, GFR 15-29 ml/min (  HCC) 12/13/2019   Colorectal polyps 04/16/2015   DVT (deep venous thrombosis) (HCC)    DVT of lower extremity, bilateral (HCC) 07/04/2014   Eczema 02/24/2000   Essential hypertension    History of pulmonary  embolism 06/25/2020   Hypercoagulable state (HCC) 04/16/2016   Last Assessment & Plan:  Formatting of this note might be different from the original. Stable with apixaban   Hypertension    Hypertriglyceridemia 08/27/2014   Last Assessment & Plan:  Formatting of this note might be different from the original. Lipid abnormalities are improving with lifestyle modifications. Nutritional counseling was provided. Lipids will be reassessed in 1 year.   Insomnia 05/09/2007   Mixed dyslipidemia 06/25/2020   Need for prophylactic vaccination and inoculation against influenza 03/25/2022   Obesity (BMI 35.0-39.9 without comorbidity) 06/25/2020   On intra-aortic balloon pump assist    Osteoarthritis of knee 11/19/2011   Last Assessment & Plan:  Formatting of this note might be different from the original. Deteriorated and proceed with referral to ortho   Other meniscus derangements, other lateral meniscus, unspecified knee 11/11/2011   Pneumonia    Pulmonary emboli [facility])    Pulmonary embolism during current hospitalization (HCC) 12/13/2019   ST elevation myocardial infarction (STEMI) [facility])    Status post coronary artery stent placement    Trapezius muscle spasm 08/21/2018   Formatting of this note might be different from the original. ROM exercises twice daily Cyclobenzaprine prn   Vitamin D deficiency 06/14/2018   Formatting of this note might be different from the original. Improved with supplement    Past Surgical History:  Procedure Laterality Date   CORONARY STENT INTERVENTION N/A 12/13/2019   Procedure: CORONARY STENT INTERVENTION;  Surgeon: Lyn Records, MD;  [facility]: [facility] INVASIVE CV LAB;  Service: Cardiovascular;  Laterality: N/A;   CORONARY/GRAFT ACUTE MI REVASCULARIZATION N/A 12/13/2019   Procedure: Coronary/Graft Acute MI Revascularization;  Surgeon: Lyn Records, MD;  Location: [facility] INVASIVE CV LAB;  Service: Cardiovascular;  Laterality: N/A;   IABP INSERTION N/A 12/13/2019   Procedure:  IABP Insertion;  Surgeon: Lyn Records, MD;  Location: [facility] INVASIVE CV LAB;  Service: Cardiovascular;  Laterality: N/A;   LEFT HEART CATH AND CORONARY ANGIOGRAPHY N/A 12/13/2019   Procedure: LEFT HEART CATH AND CORONARY ANGIOGRAPHY;  Surgeon: Lyn Records, MD;  Location: [facility] INVASIVE CV LAB;  Service: Cardiovascular;  Laterality: N/A;    Current Medications: Current Meds  Medication Sig   allopurinol (ZYLOPRIM) 100 MG tablet TAKE 1 TABLET BY MOUTH EVERY DAY   aspirin EC 81 MG tablet Take 1 tablet (81 mg total) by mouth daily. Swallow whole.   atorvastatin (LIPITOR) 80 MG tablet Take 1 tablet (80 mg total) by mouth daily.   benzonatate (TESSALON) 100 MG capsule Take 1 capsule (100 mg total) by mouth 3 (three) times daily as needed for cough.   Cholecalciferol (VITAMIN D3) 125 MCG (5000 UT) CAPS Take 5,000 Units by mouth daily with breakfast.   clobetasol ointment (TEMOVATE) 0.05 % Apply 1 application topically as needed for rash.   colchicine 0.6 MG tablet Take 2 tabs and repeat 1 tab in an hr and then take 1 tab daily until resolution of flare.   fluticasone (FLONASE) 50 MCG/ACT nasal spray Place 2 sprays into both nostrils daily.   hydrochlorothiazide (HYDRODIURIL) 12.5 MG tablet Take 1 tablet (12.5 mg total) by mouth daily.   nitroGLYCERIN (NITROSTAT) 0.4 MG SL tablet Place 1 tablet (0.4 mg total) under the tongue every 5 (  five) minutes x 3 doses as needed for chest pain. Patient needs an appointment for further refills   sacubitril-valsartan (ENTRESTO) 97-103 MG Take 1 tablet by mouth 2 (two) times daily. Must keep appt in April for additional refills   spironolactone (ALDACTONE) 25 MG tablet Take 0.5 tablets (12.5 mg total) by mouth daily. 2nd attempt, patient needs an appt for additional refills   XARELTO 20 MG TABS tablet TAKE 1 TABLET BY MOUTH DAILY WITH SUPPER     Allergies:   Patient has no known allergies.   Social History   Socioeconomic History   Marital status: Single     Spouse name: Not on file   Number of children: Not on file   Years of education: Not on file   Highest education level: Bachelor's degree (e.g., BA, AB, BS)  Occupational History   Occupation: retired  Tobacco Use   Smoking status: Never   Smokeless tobacco: Current    Types: Chew  Vaping Use   Vaping status: Never Used  Substance and Sexual Activity   Alcohol use: Yes    Comment: daily   Drug use: Never   Sexual activity: Not on file  Other Topics Concern   Not on file  Social History Narrative   Not on file   Social Drivers of Health   Financial Resource Strain: Low Risk  (11/30/2023)   Overall Financial Resource Strain (CARDIA)    Difficulty of Paying Living Expenses: Not hard at all  Food Insecurity: No Food Insecurity (11/30/2023)   Hunger Vital Sign    Worried About Running Out of Food in the Last Year: Never true    Ran Out of Food in the Last Year: Never true  Transportation Needs: No Transportation Needs (11/30/2023)   PRAPARE - Administrator, Civil Service (Medical): No    Lack of Transportation (Non-Medical): No  Physical Activity: Sufficiently Active (11/30/2023)   Exercise Vital Sign    Days of Exercise per Week: 5 days    Minutes of Exercise per Session: 150+ min  Stress: No Stress Concern Present (11/30/2023)   [employer] of Occupational Health - Occupational Stress Questionnaire    Feeling of Stress : Not at all  Social Connections: Socially Isolated (11/30/2023)   Social Connection and Isolation Panel [NHANES]    Frequency of Communication with Friends and Family: Three times a week    Frequency of Social Gatherings with Friends and Family: Three times a week    Attends Religious Services: Never    Active Member of Clubs or Organizations: No    Attends Engineer, structural: Not on file    Marital Status: Divorced     Family History: The patient's family history includes Cancer in his mother; Heart attack in his paternal  uncle; Hypertension in his father; Kidney disease in his mother. There is no history of Diabetes or Heart disease.  ROS:   Please see the history of present illness.    All other systems reviewed and are negative.  EKGs/Labs/Other Studies Reviewed:    The following studies were reviewed today: I discussed my findings with the patient at length   Recent Labs: 12/07/2023: ALT 16; BUN 35; Creatinine, Ser 1.59; Potassium 4.5; Sodium 136  Recent Lipid Panel    Component Value Date/Time   CHOL 101 12/07/2023 1258   CHOL 102 03/26/2022 1031   TRIG 57.0 12/07/2023 1258   HDL 49.40 12/07/2023 1258   HDL 43 03/26/2022 1031   CHOLHDL  2 12/07/2023 1258   VLDL 11.4 12/07/2023 1258   LDLCALC 40 12/07/2023 1258   LDLCALC 40 03/26/2022 1031    Physical Exam:    VS:  BP (!) 90/58   Pulse 68   Ht 5\' 9"  (1.753 m)   Wt 234 lb (106.1 kg)   SpO2 96%   BMI 34.56 kg/m     Wt Readings from Last 3 Encounters:  01/05/24 234 lb (106.1 kg)  12/16/23 230 lb (104.3 kg)  12/07/23 237 lb 12.8 oz (107.9 kg)     GEN: Patient is in no acute distress HEENT: Normal NECK: No JVD; No carotid bruits LYMPHATICS: No lymphadenopathy CARDIAC: Hear sounds regular, 2/6 systolic murmur at the apex. RESPIRATORY:  Clear to auscultation without rales, wheezing or rhonchi  ABDOMEN: Soft, non-tender, non-distended MUSCULOSKELETAL:  No edema; No deformity  SKIN: Warm and dry NEUROLOGIC:  Alert and oriented x 3 PSYCHIATRIC:  Normal affect   Signed, Garwin Brothers, MD  01/05/2024 11:46 AM    [facility]

## 2024-01-05 NOTE — Patient Instructions (Signed)
 Medication Instructions:  Your physician has recommended you make the following change in your medication:   STOP: Spirinolactone  *If you need a refill on your cardiac medications before your next appointment, please call your pharmacy*  Lab Work: None If you have labs (blood work) drawn today and your tests are completely normal, you will receive your results only by: MyChart Message (if you have MyChart) OR A paper copy in the mail If you have any lab test that is abnormal or we need to change your treatment, we will call you to review the results.  Testing/Procedures: None  Follow-Up: At [facility], you and your health needs are our priority.  As part of our continuing mission to provide you with exceptional heart care, our providers are all part of one team.  This team includes your primary Cardiologist (physician) and Advanced Practice Providers or APPs (Physician Assistants and Nurse Practitioners) who all work together to provide you with the care you need, when you need it.  Your next appointment:   9 month(s)  Provider:   Belva Crome, MD    We recommend signing up for the patient portal called "MyChart".  Sign up information is provided on this After Visit Summary.  MyChart is used to connect with patients for Virtual Visits (Telemedicine).  Patients are able to view lab/test results, encounter notes, upcoming appointments, etc.  Non-urgent messages can be sent to your provider as well.   To learn more about what you can do with MyChart, go to [email or web address].   Other Instructions None

## 2024-01-09 ENCOUNTER — Telehealth: Payer: Self-pay | Admitting: Cardiology

## 2024-01-09 MED ORDER — ENTRESTO 97-103 MG PO TABS: 1.0000 | ORAL_TABLET | Freq: Two times a day (BID) | ORAL | 12 refills | Status: AC

## 2024-01-09 NOTE — Telephone Encounter (Signed)
*  STAT* If patient is at the pharmacy, call can be transferred to refill team.   1. Which medications need to be refilled? (please list name of each medication and dose if known) sacubitril-valsartan (ENTRESTO) 97-103 MG  Take 1 tablet by mouth 2 (two) times daily.   2. Would you like to learn more about the convenience, safety, & potential cost savings by using the [facility]? No   3. Are you open to using the [facility] No   4. Which pharmacy/location (including street and city if local pharmacy) is medication to be sent to? [id number] - [place], [place] - [address]     5. Do they need a 30 day or 90 day supply? 30 Day Supply  Pt was last seen on 01/05/24 w/ Dr. Tomie China. Patient is currently out of medication.

## 2024-01-09 NOTE — Telephone Encounter (Signed)
 RX has been sent.

## 2024-01-24 ENCOUNTER — Other Ambulatory Visit: Payer: Self-pay | Admitting: Cardiology

## 2024-03-06 ENCOUNTER — Other Ambulatory Visit: Payer: Self-pay | Admitting: Family Medicine

## 2024-03-06 DIAGNOSIS — Z86711 Personal history of pulmonary embolism: Secondary | ICD-10-CM

## 2024-03-08 ENCOUNTER — Ambulatory Visit

## 2024-03-08 VITALS — BP 90/58 | Ht 69.0 in | Wt 230.0 lb

## 2024-03-08 DIAGNOSIS — Z0000 Encounter for general adult medical examination without abnormal findings: Secondary | ICD-10-CM | POA: Diagnosis not present

## 2024-03-08 NOTE — Progress Notes (Signed)
 Because this visit was a virtual/telehealth visit,  certain criteria was not obtained, such a blood pressure, CBG if applicable, and timed get up and go. Any medications not marked as "taking" were not mentioned during the medication reconciliation part of the visit. Any vitals not documented were not able to be obtained due to this being a telehealth visit or patient was unable to self-report a recent blood pressure reading due to a lack of equipment at home via telehealth. Vitals that have been documented are verbally provided by the patient.  This visit was performed by a medical professional under my direct supervision. I was immediately available for consultation/collaboration. I have reviewed and agree with the Annual Wellness Visit documentation.  Subjective:   Thomas Carpenter is a 73 y.o. who presents for a Medicare Wellness preventive visit.  As a reminder, Annual Wellness Visits don't include a physical exam, and some assessments may be limited, especially if this visit is performed virtually. We may recommend an in-person follow-up visit with your provider if needed.  Visit Complete: Virtual I connected with  Thomas Carpenter on 03/08/24 by a audio enabled telemedicine application and verified that I am speaking with the correct person using two identifiers.  Patient Location: Home  Provider Location: Home Office  I discussed the limitations of evaluation and management by telemedicine. The patient expressed understanding and agreed to proceed.  Vital Signs: Because this visit was a virtual/telehealth visit, some criteria may be missing or patient reported. Any vitals not documented were not able to be obtained and vitals that have been documented are patient reported.  VideoDeclined- This patient declined Librarian, academic. Therefore the visit was completed with audio only.  Persons Participating in Visit: Patient.  AWV Questionnaire: No: Patient Medicare  AWV questionnaire was not completed prior to this visit.  Cardiac Risk Factors include: advanced age (>53men, >29 women);hypertension;obesity (BMI >30kg/m2);male gender     Objective:     Today's Vitals   03/01/24 1422 03/08/24 1145  BP:  (!) 90/58  Weight:  230 lb (104.3 kg)  Height:  5\' 9"  (1.753 m)  PainSc: 1     Body mass index is 33.97 kg/m.     03/08/2024   11:51 AM 03/08/2023    2:27 PM 03/04/2022    1:49 PM 02/26/2021    1:23 PM 12/14/2019    1:00 AM 12/13/2019    9:16 PM  Advanced Directives  Does Patient Have a Medical Advance Directive? No Yes No Yes  No  Type of Special educational needs teacher of [place];Living will  Healthcare Power of [place];Living will    Copy of Healthcare Power of Attorney in Chart?  No - copy requested  No - copy requested    Would patient like information on creating a medical advance directive? No - Patient declined  No - Patient declined  No - Patient declined     Current Medications (verified) Outpatient Encounter Medications as of 03/08/2024  Medication Sig   allopurinol  (ZYLOPRIM ) 100 MG tablet TAKE 1 TABLET BY MOUTH EVERY DAY   aspirin  EC 81 MG tablet Take 1 tablet (81 mg total) by mouth daily. Swallow whole.   atorvastatin  (LIPITOR ) 80 MG tablet Take 1 tablet (80 mg total) by mouth daily.   Cholecalciferol (VITAMIN D3) 125 MCG (5000 UT) CAPS Take 5,000 Units by mouth daily with breakfast.   clobetasol ointment (TEMOVATE) 0.05 % Apply 1 application topically as needed for rash.   colchicine  0.6 MG tablet Take  2 tabs and repeat 1 tab in an hr and then take 1 tab daily until resolution of flare.   hydrochlorothiazide  (HYDRODIURIL ) 12.5 MG tablet Take 1 tablet (12.5 mg total) by mouth daily.   nitroGLYCERIN  (NITROSTAT ) 0.4 MG SL tablet Place 1 tablet (0.4 mg total) under the tongue every 5 (five) minutes x 3 doses as needed for chest pain. Patient needs an appointment for further refills   sacubitril -valsartan  (ENTRESTO ) 97-103 MG Take 1  tablet by mouth 2 (two) times daily.   XARELTO  20 MG TABS tablet TAKE 1 TABLET BY MOUTH DAILY WITH SUPPER   benzonatate  (TESSALON ) 100 MG capsule Take 1 capsule (100 mg total) by mouth 3 (three) times daily as needed for cough. (Patient not taking: Reported on 03/08/2024)   fluticasone  (FLONASE ) 50 MCG/ACT nasal spray Place 2 sprays into both nostrils daily. (Patient not taking: Reported on 03/08/2024)   No facility-administered encounter medications on file as of 03/08/2024.    Allergies (verified) Patient has no known allergies.   History: Past Medical History:  Diagnosis Date   Actinic keratoses 09/01/2020   Acute diastolic CHF (congestive heart failure) (HCC) 07/04/2014   Acute ST elevation myocardial infarction (STEMI) due to occlusion of distal portion of left anterior descending (LAD) coronary artery (HCC) 12/14/2019   Acute ST elevation myocardial infarction (STEMI) due to occlusion of left anterior descending (LAD) coronary artery (HCC) 12/13/2019   Acute systolic congestive heart failure (HCC) 12/13/2019   Adiposity 12/03/2008   Last Assessment & Plan:  Formatting of this note might be different from the original. Obesity is improving with lifestyle modifications. Discussed the patient's BMI.  The BMI is above average; BMI management plan is completed. General weight loss/lifestyle modification strategies discussed (elicit support from others; identify saboteurs; non-food rewards, etc).   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 Stable with apixaban    Hypertension    Hypertriglyceridemia 08/27/2014   Last Assessment & Plan:  Formatting of this note might be different from the original. Lipid abnormalities are improving with lifestyle modifications. Nutritional counseling was provided. Lipids will be reassessed in 1 year.   Insomnia 05/09/2007   Mixed dyslipidemia 06/25/2020   Need for prophylactic vaccination and inoculation against influenza 03/25/2022   Obesity (BMI 35.0-39.9 without comorbidity) 06/25/2020   On intra-aortic balloon pump assist    Osteoarthritis of knee 11/19/2011   Last Assessment & Plan:  Formatting of this note might be different from the original. Deteriorated and proceed with referral to ortho   Other meniscus derangements, other lateral meniscus, unspecified knee 11/11/2011   Pneumonia    Pulmonary emboli [facility])    Pulmonary embolism during current hospitalization (HCC) 12/13/2019   ST elevation myocardial infarction (STEMI) [facility])    Status post coronary artery stent placement    Trapezius muscle spasm 08/21/2018   Formatting of this note might be different from the original. ROM  exercises twice daily Cyclobenzaprine prn   Vitamin D  deficiency 06/14/2018   Formatting of this note might be different from the original. Improved with supplement   Past Surgical History:  Procedure Laterality Date   CORONARY STENT INTERVENTION N/A 12/13/2019   Procedure: CORONARY STENT INTERVENTION;  Surgeon: Arty Binning, MD;  Location: [facility] INVASIVE CV LAB;  Service: Cardiovascular;  Laterality: N/A;   CORONARY/GRAFT ACUTE MI REVASCULARIZATION N/A 12/13/2019   Procedure: Coronary/Graft Acute MI Revascularization;  Surgeon: Arty Binning, MD;  Location: [facility] INVASIVE CV LAB;  Service: Cardiovascular;  Laterality: N/A;   IABP INSERTION N/A 12/13/2019   Procedure:  IABP Insertion;  Surgeon: Arty Binning, MD;  Location: [facility] INVASIVE CV LAB;  Service: Cardiovascular;  Laterality: N/A;   LEFT HEART CATH AND CORONARY ANGIOGRAPHY N/A 12/13/2019   Procedure: LEFT HEART CATH AND CORONARY ANGIOGRAPHY;  Surgeon: Arty Binning, MD;  Location: [facility] INVASIVE CV LAB;  Service: Cardiovascular;  Laterality: N/A;   Family History  Problem Relation Age of Onset   Kidney disease Mother    Cancer Mother    Hypertension Father    Heart attack Paternal Uncle    Diabetes Neg Hx    Heart disease Neg Hx    Social History   Socioeconomic History   Marital status: Single    Spouse name: Not on file   Number of children: Not on file   Years of education: Not on file   Highest education level: Bachelor's degree (e.g., BA, AB, BS)  Occupational History   Occupation: retired  Tobacco Use   Smoking status: Never   Smokeless tobacco: Current    Types: Chew  Vaping Use   Vaping status: Never Used  Substance and Sexual Activity   Alcohol use: Yes    Comment: daily   Drug use: Never   Sexual activity: Not on file  Other Topics Concern   Not on file  Social History Narrative   Not on file   Social Drivers of Health   Financial Resource Strain: Low Risk  (03/08/2024)   Overall Financial Resource Strain (CARDIA)    Difficulty of Paying Living Expenses: Not hard at all  Food Insecurity: No Food Insecurity (03/08/2024)   Hunger Vital Sign    Worried About Running Out of Food in the Last Year: Never true    Ran Out of Food in the Last Year: Never true  Transportation Needs: No Transportation Needs (03/08/2024)   PRAPARE - Administrator, Civil Service (Medical): No    Lack of Transportation (Non-Medical): No  Physical Activity: Sufficiently Active (03/08/2024)   Exercise Vital Sign    Days of Exercise per Week: 5 days    Minutes of Exercise per Session: 150+ min  Stress: No Stress Concern Present (03/08/2024)   [employer] of Occupational Health -  Occupational Stress Questionnaire    Feeling of Stress : Not at all  Social Connections: Socially Isolated (03/08/2024)   Social Connection and Isolation Panel [NHANES]    Frequency of Communication with Friends and Family: Three times a week    Frequency of Social Gatherings with Friends and Family: Three times a week    Attends Religious Services: Never    Active Member of Clubs or Organizations: No    Attends Banker Meetings: Never    Marital Status: Divorced    Tobacco Counseling Ready to quit: Not Answered Counseling given: Not Answered    Clinical Intake:  Pre-visit preparation completed: Yes  Pain : 0-10 Pain Score: 1  Pain Type: Chronic pain Pain Location: Knee Pain Orientation: Left Pain Descriptors / Indicators: Constant, Burning, Aching Pain Onset: Today Pain Frequency: Intermittent     BMI - recorded: 33.97 Nutritional Status: BMI > 30  Obese Nutritional Risks: None Diabetes: No  Lab Results  Component Value Date   HGBA1C 5.6 03/26/2022   HGBA1C 5.5 12/13/2019     How often do you need to have someone help you when you read instructions, pamphlets, or other written materials from your doctor or pharmacy?: 1 - Never What is the last grade level you completed in school?: college degree  Interpreter Needed?: No  Information Entered By Patient Questionnaire 03/01/2024   Activities of Daily Living     03/01/2024    2:22 PM 12/07/2023   12:42 PM  In your present state of health, do you have any difficulty performing the following activities:  Hearing? 0 0  Vision? 0 0  Difficulty concentrating or making decisions? 0 0  Walking or climbing stairs? 0 0  Dressing or bathing? 0 0  Doing errands, shopping? 0 0  Preparing Food and eating ? N N  Using the Toilet? N N  In the past six months, have you accidently leaked urine? N N  Do you have problems with loss of bowel control? N N  Managing your Medications? N N  Managing your Finances? N  N  Housekeeping or managing your Housekeeping? N N    Patient Care Team: Jobe Mulder, DO as PCP - General (Family Medicine) Bensimhon, Rheta Celestine, MD as PCP - Advanced Heart Failure (Cardiology)  I have updated your Care Teams any recent Medical Services you may have received from other providers in the past year.     Assessment:    This is a routine wellness examination for Thomas Carpenter.  Hearing/Vision screen Hearing Screening - Comments:: Patient has no hearing difficulties Vision Screening - Comments:: Patient wears glasses   Goals Addressed             This Visit's Progress    Patient Stated   On track    Maintain current health       Depression Screen     03/08/2024   11:52 AM 12/07/2023   12:39 PM 03/08/2023    2:34 PM 03/04/2022    1:52 PM 02/26/2021    1:25 PM  PHQ 2/9 Scores  PHQ - 2 Score 0 0 0 0 0  PHQ- 9 Score 0        Fall Risk     03/01/2024    2:22 PM 12/07/2023   12:39 PM 03/08/2023    2:34 PM 11/01/2022    3:24 PM 03/04/2022    1:52 PM  Fall Risk   Falls in the past year? 0 0 0 0 0  Number falls in past yr: 0 0 0 0 0  Injury with Fall? 0 0 0 0 0  Risk for fall due to : No Fall Risks No Fall Risks No Fall Risks  No Fall Risks  Follow up Falls evaluation completed;Falls prevention discussed  Falls evaluation completed  Falls evaluation completed    MEDICARE RISK AT HOME:  Medicare Risk at Home Any stairs in or around the home?: Yes If so, are there any without handrails?: No Home free of loose throw rugs in walkways, pet beds, electrical cords, etc?: Yes Adequate lighting in your home to reduce risk of falls?: Yes Life alert?: No Use  of a cane, walker or w/c?: No Grab bars in the bathroom?: No Shower chair or bench in shower?: No Elevated toilet seat or a handicapped toilet?: No  TIMED UP AND GO:  Was the test performed?  No  Cognitive Function: 6CIT completed        03/08/2024   11:49 AM 03/08/2023    2:35 PM 03/04/2022    1:56 PM   6CIT Screen  What Year? 0 points 0 points 0 points  What month? 0 points 3 points 0 points  What time? 0 points 0 points 0 points  Count back from 20 0 points 0 points 0 points  Months in reverse 0 points 0 points 0 points  Repeat phrase 0 points 0 points 0 points  Total Score 0 points 3 points 0 points    Immunizations Immunization History  Administered Date(s) Administered   DTaP 08/16/2006   Fluad Trivalent(High Dose 65+) 06/04/2018   Influenza, High Dose Seasonal PF 08/14/2019, 07/02/2020, 08/23/2022, 07/08/2023   Influenza-Unspecified 08/11/2021   Moderna SARS-COV2 Booster Vaccination 08/23/2022   PFIZER(Purple Top)SARS-COV-2 Vaccination 11/08/2019, 11/29/2019, 09/01/2020   PNEUMOCOCCAL CONJUGATE-20 12/03/2022   Pfizer Covid-19 Vaccine Bivalent Booster 33yrs & up 09/11/2021   Pneumococcal Conjugate-13 06/14/2018   RSV,unspecified 08/23/2022   Tdap 12/03/2008, 12/02/2020   Unspecified SARS-COV-2 Vaccination 07/08/2023    Screening Tests Health Maintenance  Topic Date Due   COVID-19 Vaccine (6 - Pfizer risk 2024-25 season) 06/08/2024 (Originally 01/06/2024)   Hepatitis C Screening  12/07/2026 (Originally 08/27/1969)   INFLUENZA VACCINE  05/04/2024   Medicare Annual Wellness (AWV)  03/08/2025   Colonoscopy  04/15/2025   DTaP/Tdap/Td (4 - Td or Tdap) 12/03/2030   Pneumonia Vaccine 37+ Years old  Completed   HPV VACCINES  Aged Out   Meningococcal B Vaccine  Aged Out   Zoster Vaccines- Shingrix  Discontinued    Health Maintenance  There are no preventive care reminders to display for this patient. Health Maintenance Items Addressed:   Additional Screening:  Vision Screening: Recommended annual ophthalmology exams for early detection of glaucoma and other disorders of the eye. Would you like a referral to an eye doctor? No    Dental Screening: Recommended annual dental exams for proper oral hygiene  Community Resource Referral / Chronic Care Management: CRR  required this visit?  No   CCM required this visit?  No   Plan:    I have personally reviewed and noted the following in the patient's chart:   Medical and social history Use of alcohol, tobacco or illicit drugs  Current medications and supplements including opioid prescriptions. Patient is not currently taking opioid prescriptions. Functional ability and status Nutritional status Physical activity Advanced directives List of other physicians Hospitalizations, surgeries, and ER visits in previous 12 months Vitals Screenings to include cognitive, depression, and falls Referrals and appointments  In addition, I have reviewed and discussed with patient certain preventive protocols, quality metrics, and best practice recommendations. A written personalized care plan for preventive services as well as general preventive health recommendations were provided to patient.   Freeda Jerry, [place]   03/08/2024   After Visit Summary: (MyChart) Due to this being a telephonic visit, the after visit summary with patients personalized plan was offered to patient via MyChart   Notes: Nothing significant to report at this time.

## 2024-03-08 NOTE — Patient Instructions (Signed)
 Mr. Thomas Carpenter , Thank you for taking time out of your busy schedule to complete your Annual Wellness Visit with me. I enjoyed our conversation and look forward to speaking with you again next year. I, as well as your care team,  appreciate your ongoing commitment to your health goals. Please review the following plan we discussed and let me know if I can assist you in the future. Your Game plan/ To Do List    Referrals: If you haven't heard from the office you've been referred to, please reach out to them at the phone provided.   Follow up Visits: Next Medicare AWV with our clinical staff: 03/14/2025   Have you seen your provider in the last 6 months (3 months if uncontrolled diabetes)? Yes Next Office Visit with your provider: 12/07/2024  Clinician Recommendations:  Aim for 30 minutes of exercise or brisk walking, 6-8 glasses of water, and 5 servings of fruits and vegetables each day.       This is a list of the screening recommended for you and due dates:  Health Maintenance  Topic Date Due   COVID-19 Vaccine (6 - Pfizer risk 2024-25 season) 06/08/2024*   Hepatitis C Screening  12/07/2026*   Flu Shot  05/04/2024   Medicare Annual Wellness Visit  03/08/2025   Colon Cancer Screening  04/15/2025   DTaP/Tdap/Td vaccine (4 - Td or Tdap) 12/03/2030   Pneumonia Vaccine  Completed   HPV Vaccine  Aged Out   Meningitis B Vaccine  Aged Out   Zoster (Shingles) Vaccine  Discontinued  *Topic was postponed. The date shown is not the original due date.    Advanced directives: (Declined) Advance directive discussed with you today. Even though you declined this today, please call our office should you change your mind, and we can give you the proper paperwork for you to fill out. Advance Care Planning is important because it:  [x]  Makes sure you receive the medical care that is consistent with your values, goals, and preferences  [x]  It provides guidance to your family and loved ones and reduces  their decisional burden about whether or not they are making the right decisions based on your wishes.  Follow the link provided in your after visit summary or read over the paperwork we have mailed to you to help you started getting your Advance Directives in place. If you need assistance in completing these, please reach out to us  so that we can help you!  See attachments for Preventive Care and Fall Prevention Tips.

## 2024-03-14 NOTE — Progress Notes (Signed)
 The note was copied and a addendum was completed to change the date the patient completed the questionnaire.  Because this visit was a virtual/telehealth visit,  certain criteria was not obtained, such a blood pressure, CBG if applicable, and timed get up and go. Any medications not marked as taking were not mentioned during the medication reconciliation part of the visit. Any vitals not documented were not able to be obtained due to this being a telehealth visit or patient was unable to self-report a recent blood pressure reading due to a lack of equipment at home via telehealth. Vitals that have been documented are verbally provided by the patient.  This visit was performed by a medical professional under my direct supervision. I was immediately available for consultation/collaboration. I have reviewed and agree with the Annual Wellness Visit documentation.  Subjective:   Thomas Carpenter is a 73 y.o. who presents for a Medicare Wellness preventive visit.  As a reminder, Annual Wellness Visits don't include a physical exam, and some assessments may be limited, especially if this visit is performed virtually. We may recommend an in-person follow-up visit with your provider if needed.  Visit Complete: Virtual I connected with  Thomas Carpenter on 03/14/24 by a audio enabled telemedicine application and verified that I am speaking with the correct person using two identifiers.  Patient Location: Home  Provider Location: Home Office  I discussed the limitations of evaluation and management by telemedicine. The patient expressed understanding and agreed to proceed.  Vital Signs: Because this visit was a virtual/telehealth visit, some criteria may be missing or patient reported. Any vitals not documented were not able to be obtained and vitals that have been documented are patient reported.  VideoDeclined- This patient declined Librarian, academic. Therefore the visit was  completed with audio only.  Persons Participating in Visit: Patient.  AWV Questionnaire: yes patient questionnaire was completed prior to the the visit on 03/01/2024 Cardiac Risk Factors include: advanced age (>43men, >37 women);hypertension;obesity (BMI >30kg/m2);male gender     Objective:     Today's Vitals   03/01/24 1422 03/08/24 1145  BP:  (!) 90/58  Weight:  230 lb (104.3 kg)  Height:  5' 9 (1.753 m)  PainSc: 1     Body mass index is 33.97 kg/m.     03/08/2024   11:51 AM 03/08/2023    2:27 PM 03/04/2022    1:49 PM 02/26/2021    1:23 PM 12/14/2019    1:00 AM 12/13/2019    9:16 PM  Advanced Directives  Does Patient Have a Medical Advance Directive? No Yes No Yes  No  Type of Special educational needs teacher of [place];Living will  Healthcare Power of [place];Living will    Copy of Healthcare Power of Attorney in Chart?  No - copy requested  No - copy requested    Would patient like information on creating a medical advance directive? No - Patient declined  No - Patient declined  No - Patient declined     Current Medications (verified) Outpatient Encounter Medications as of 03/08/2024  Medication Sig   allopurinol  (ZYLOPRIM ) 100 MG tablet TAKE 1 TABLET BY MOUTH EVERY DAY   aspirin  EC 81 MG tablet Take 1 tablet (81 mg total) by mouth daily. Swallow whole.   atorvastatin  (LIPITOR ) 80 MG tablet Take 1 tablet (80 mg total) by mouth daily.   Cholecalciferol (VITAMIN D3) 125 MCG (5000 UT) CAPS Take 5,000 Units by mouth daily with breakfast.   clobetasol  ointment (TEMOVATE) 0.05 % Apply 1 application topically as needed for rash.   colchicine  0.6 MG tablet Take 2 tabs and repeat 1 tab in an hr and then take 1 tab daily until resolution of flare.   hydrochlorothiazide  (HYDRODIURIL ) 12.5 MG tablet Take 1 tablet (12.5 mg total) by mouth daily.   nitroGLYCERIN  (NITROSTAT ) 0.4 MG SL tablet Place 1 tablet (0.4 mg total) under the tongue every 5 (five) minutes x 3 doses as needed for  chest pain. Patient needs an appointment for further refills   sacubitril -valsartan  (ENTRESTO ) 97-103 MG Take 1 tablet by mouth 2 (two) times daily.   XARELTO  20 MG TABS tablet TAKE 1 TABLET BY MOUTH DAILY WITH SUPPER   benzonatate  (TESSALON ) 100 MG capsule Take 1 capsule (100 mg total) by mouth 3 (three) times daily as needed for cough. (Patient not taking: Reported on 03/08/2024)   fluticasone  (FLONASE ) 50 MCG/ACT nasal spray Place 2 sprays into both nostrils daily. (Patient not taking: Reported on 03/08/2024)   No facility-administered encounter medications on file as of 03/08/2024.    Allergies (verified) Patient has no known allergies.   History: Past Medical History:  Diagnosis Date   Actinic keratoses 09/01/2020   Acute diastolic CHF (congestive heart failure) (HCC) 07/04/2014   Acute ST elevation myocardial infarction (STEMI) due to occlusion of distal portion of left anterior descending (LAD) coronary artery (HCC) 12/14/2019   Acute ST elevation myocardial infarction (STEMI) due to occlusion of left anterior descending (LAD) coronary artery (HCC) 12/13/2019   Acute systolic congestive heart failure (HCC) 12/13/2019   Adiposity 12/03/2008   Last Assessment & Plan:  Formatting of this note might be different from the original. Obesity is improving with lifestyle modifications. Discussed the patient's BMI.  The BMI is above average; BMI management plan is completed. General weight loss/lifestyle modification strategies discussed (elicit support from others; identify saboteurs; non-food rewards, etc).   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 Stable with apixaban    Hypertension    Hypertriglyceridemia 08/27/2014   Last Assessment & Plan:  Formatting of this note might be different from the original. Lipid abnormalities are improving with lifestyle modifications. Nutritional counseling was provided. Lipids will be reassessed in 1 year.   Insomnia 05/09/2007   Mixed dyslipidemia 06/25/2020   Need for prophylactic vaccination and inoculation against influenza 03/25/2022   Obesity (BMI 35.0-39.9 without comorbidity) 06/25/2020   On intra-aortic balloon pump assist    Osteoarthritis of knee 11/19/2011   Last Assessment & Plan:  Formatting of this note might be different from the original. Deteriorated and proceed with referral to ortho   Other meniscus derangements, other lateral meniscus, unspecified knee 11/11/2011   Pneumonia    Pulmonary emboli [facility])    Pulmonary embolism during current hospitalization (HCC) 12/13/2019   ST elevation myocardial infarction (STEMI) [facility])    Status post coronary artery stent placement  Trapezius muscle spasm 08/21/2018   Formatting of this note might be different from the original. ROM exercises twice daily Cyclobenzaprine prn   Vitamin D  deficiency 06/14/2018   Formatting of this note might be different from the original. Improved with supplement   Past Surgical History:  Procedure Laterality Date   CORONARY STENT INTERVENTION N/A 12/13/2019   Procedure: CORONARY STENT INTERVENTION;  Surgeon: Arty Binning, MD;  Location[facility] INVASIVE CV LAB;  Service: Cardiovascular;  Laterality: N/A;   CORONARY/GRAFT ACUTE MI REVASCULARIZATION N/A 12/13/2019   Procedure: Coronary/Graft Acute MI Revascularization;  Surgeon: Arty Binning, MD;  Location:  [facility] INVASIVE CV LAB;  Service: Cardiovascular;  Laterality: N/A;   IABP INSERTION N/A 12/13/2019   Procedure: IABP Insertion;  Surgeon: Arty Binning, MD;  Location: [facility] INVASIVE CV LAB;  Service: Cardiovascular;  Laterality: N/A;   LEFT HEART CATH AND CORONARY ANGIOGRAPHY N/A 12/13/2019   Procedure: LEFT HEART CATH AND CORONARY ANGIOGRAPHY;  Surgeon: Arty Binning, MD;  Location: [facility] INVASIVE CV LAB;  Service: Cardiovascular;  Laterality: N/A;   Family History  Problem Relation Age of Onset   Kidney disease Mother    Cancer Mother    Hypertension Father    Heart attack Paternal Uncle    Diabetes Neg Hx    Heart disease Neg Hx    Social History   Socioeconomic History   Marital status: Single    Spouse name: Not on file   Number of children: Not on file   Years of education: Not on file   Highest education level: Bachelor's degree (e.g., BA, AB, BS)  Occupational History   Occupation: retired  Tobacco Use   Smoking status: Never   Smokeless tobacco: Current    Types: Chew  Vaping Use   Vaping status: Never Used  Substance and Sexual Activity   Alcohol use: Yes    Comment: daily   Drug use: Never   Sexual activity: Not on file  Other Topics Concern   Not on file  Social History Narrative   Not on file   Social Drivers of Health   Financial Resource Strain: Low Risk  (03/08/2024)   Overall Financial Resource Strain (CARDIA)    Difficulty of Paying Living Expenses: Not hard at all  Food Insecurity: No Food Insecurity (03/08/2024)   Hunger Vital Sign    Worried About Running Out of Food in the Last Year: Never true    Ran Out of Food in the Last Year: Never true  Transportation Needs: No Transportation Needs (03/08/2024)   PRAPARE - Administrator, Civil Service (Medical): No    Lack of Transportation (Non-Medical): No  Physical Activity: Sufficiently Active (03/08/2024)   Exercise Vital Sign    Days of Exercise per Week: 5 days    Minutes of Exercise per Session:  150+ min  Stress: No Stress Concern Present (03/08/2024)   [employer] of Occupational Health - Occupational Stress Questionnaire    Feeling of Stress : Not at all  Social Connections: Socially Isolated (03/08/2024)   Social Connection and Isolation Panel [NHANES]    Frequency of Communication with Friends and Family: Three times a week    Frequency of Social Gatherings with Friends and Family: Three times a week    Attends Religious Services: Never    Active Member of Clubs or Organizations: No    Attends Banker Meetings: Never    Marital Status: Divorced    Tobacco Counseling Ready to  quit: Not Answered Counseling given: Not Answered    Clinical Intake:  Pre-visit preparation completed: Yes  Pain : 0-10 Pain Score: 1  Pain Type: Chronic pain Pain Location: Knee Pain Orientation: Left Pain Descriptors / Indicators: Constant, Burning, Aching Pain Onset: Today Pain Frequency: Intermittent     BMI - recorded: 33.97 Nutritional Status: BMI > 30  Obese Nutritional Risks: None Diabetes: No  Lab Results  Component Value Date   HGBA1C 5.6 03/26/2022   HGBA1C 5.5 12/13/2019     How often do you need to have someone help you when you read instructions, pamphlets, or other written materials from your doctor or pharmacy?: 1 - Never What is the last grade level you completed in school?: college degree  Interpreter Needed?: No  Information Entered By Patient Questionnaire 03/01/2024   Activities of Daily Living     03/01/2024    2:22 PM 12/07/2023   12:42 PM  In your present state of health, do you have any difficulty performing the following activities:  Hearing? 0 0  Vision? 0 0  Difficulty concentrating or making decisions? 0 0  Walking or climbing stairs? 0 0  Dressing or bathing? 0 0  Doing errands, shopping? 0 0  Preparing Food and eating ? N N  Using the Toilet? N N  In the past six months, have you accidently leaked urine? N N  Do you have  problems with loss of bowel control? N N  Managing your Medications? N N  Managing your Finances? N N  Housekeeping or managing your Housekeeping? N N    Patient Care Team: Jobe Mulder, DO as PCP - General (Family Medicine) Bensimhon, Rheta Celestine, MD as PCP - Advanced Heart Failure (Cardiology)  I have updated your Care Teams any recent Medical Services you may have received from other providers in the past year.     Assessment:    This is a routine wellness examination for Thomas Carpenter.  Hearing/Vision screen Hearing Screening - Comments:: Patient has no hearing difficulties Vision Screening - Comments:: Patient wears glasses   Goals Addressed             This Visit's Progress    Patient Stated   On track    Maintain current health       Depression Screen     03/08/2024   11:52 AM 12/07/2023   12:39 PM 03/08/2023    2:34 PM 03/04/2022    1:52 PM 02/26/2021    1:25 PM  PHQ 2/9 Scores  PHQ - 2 Score 0 0 0 0 0  PHQ- 9 Score 0        Fall Risk     03/01/2024    2:22 PM 12/07/2023   12:39 PM 03/08/2023    2:34 PM 11/01/2022    3:24 PM 03/04/2022    1:52 PM  Fall Risk   Falls in the past year? 0 0 0 0 0  Number falls in past yr: 0 0 0 0 0  Injury with Fall? 0 0 0 0 0  Risk for fall due to : No Fall Risks No Fall Risks No Fall Risks  No Fall Risks  Follow up Falls evaluation completed;Falls prevention discussed  Falls evaluation completed  Falls evaluation completed    MEDICARE RISK AT HOME:  Medicare Risk at Home Any stairs in or around the home?: Yes If so, are there any without handrails?: No Home free of loose throw rugs in walkways, pet beds, electrical  cords, etc?: Yes Adequate lighting in your home to reduce risk of falls?: Yes Life alert?: No Use of a cane, walker or w/c?: No Grab bars in the bathroom?: No Shower chair or bench in shower?: No Elevated toilet seat or a handicapped toilet?: No  TIMED UP AND GO:  Was the test performed?  No  Cognitive  Function: 6CIT completed        03/08/2024   11:49 AM 03/08/2023    2:35 PM 03/04/2022    1:56 PM  6CIT Screen  What Year? 0 points 0 points 0 points  What month? 0 points 3 points 0 points  What time? 0 points 0 points 0 points  Count back from 20 0 points 0 points 0 points  Months in reverse 0 points 0 points 0 points  Repeat phrase 0 points 0 points 0 points  Total Score 0 points 3 points 0 points    Immunizations Immunization History  Administered Date(s) Administered   DTaP 08/16/2006   Fluad Trivalent(High Dose 65+) 06/04/2018   Influenza, High Dose Seasonal PF 08/14/2019, 07/02/2020, 08/23/2022, 07/08/2023   Influenza-Unspecified 08/11/2021   Moderna SARS-COV2 Booster Vaccination 08/23/2022   PFIZER(Purple Top)SARS-COV-2 Vaccination 11/08/2019, 11/29/2019, 09/01/2020   PNEUMOCOCCAL CONJUGATE-20 12/03/2022   Pfizer Covid-19 Vaccine Bivalent Booster 67yrs & up 09/11/2021   Pneumococcal Conjugate-13 06/14/2018   RSV,unspecified 08/23/2022   Tdap 12/03/2008, 12/02/2020   Unspecified SARS-COV-2 Vaccination 07/08/2023    Screening Tests Health Maintenance  Topic Date Due   COVID-19 Vaccine (6 - Pfizer risk 2024-25 season) 06/08/2024 (Originally 01/06/2024)   Hepatitis C Screening  12/07/2026 (Originally 08/27/1969)   INFLUENZA VACCINE  05/04/2024   Medicare Annual Wellness (AWV)  03/08/2025   Colonoscopy  04/15/2025   DTaP/Tdap/Td (4 - Td or Tdap) 12/03/2030   Pneumonia Vaccine 79+ Years old  Completed   HPV VACCINES  Aged Out   Meningococcal B Vaccine  Aged Out   Zoster Vaccines- Shingrix  Discontinued    Health Maintenance  There are no preventive care reminders to display for this patient. Health Maintenance Items Addressed:   Additional Screening:  Vision Screening: Recommended annual ophthalmology exams for early detection of glaucoma and other disorders of the eye. Would you like a referral to an eye doctor? No    Dental Screening: Recommended annual dental  exams for proper oral hygiene  Community Resource Referral / Chronic Care Management: CRR required this visit?  No   CCM required this visit?  No   Plan:    I have personally reviewed and noted the following in the patient's chart:   Medical and social history Use of alcohol, tobacco or illicit drugs  Current medications and supplements including opioid prescriptions. Patient is not currently taking opioid prescriptions. Functional ability and status Nutritional status Physical activity Advanced directives List of other physicians Hospitalizations, surgeries, and ER visits in previous 12 months Vitals Screenings to include cognitive, depression, and falls Referrals and appointments  In addition, I have reviewed and discussed with patient certain preventive protocols, quality metrics, and best practice recommendations. A written personalized care plan for preventive services as well as general preventive health recommendations were provided to patient.   Freeda Jerry, [place]   03/14/2024   After Visit Summary: (MyChart) Due to this being a telephonic visit, the after visit summary with patients personalized plan was offered to patient via MyChart   Notes: Nothing significant to report at this time.

## 2024-06-28 ENCOUNTER — Other Ambulatory Visit: Payer: Self-pay | Admitting: Family Medicine

## 2024-08-05 ENCOUNTER — Other Ambulatory Visit: Payer: Self-pay | Admitting: Family Medicine

## 2024-08-05 DIAGNOSIS — Z86711 Personal history of pulmonary embolism: Secondary | ICD-10-CM

## 2024-11-02 ENCOUNTER — Telehealth: Payer: Self-pay | Admitting: Cardiology

## 2024-11-02 NOTE — Telephone Encounter (Signed)
 Pt c/o medication issue:  1. Name of Medication:   sacubitril -valsartan  (ENTRESTO ) 97-103 MG    2. How are you currently taking this medication (dosage and times per day)? As prescribed   3. Are you having a reaction (difficulty breathing--STAT)? No   4. What is your medication issue? Pts insurance is requesting more information to determine coverage of medication. [phone] Please advise.

## 2024-12-07 ENCOUNTER — Ambulatory Visit: Admitting: Family Medicine

## 2025-03-14 ENCOUNTER — Ambulatory Visit
